# Patient Record
Sex: Female | Born: 1950 | Race: White | Hispanic: No | Marital: Married | Smoking: Former smoker
Health system: Southern US, Community
[De-identification: ages and names within clinical notes are randomized; demographics above are authoritative.]

## PROBLEM LIST (undated history)

## (undated) DIAGNOSIS — H3530 Unspecified macular degeneration: Secondary | ICD-10-CM

## (undated) DIAGNOSIS — F419 Anxiety disorder, unspecified: Secondary | ICD-10-CM

## (undated) DIAGNOSIS — R7303 Prediabetes: Secondary | ICD-10-CM

## (undated) DIAGNOSIS — E7800 Pure hypercholesterolemia, unspecified: Secondary | ICD-10-CM

## (undated) DIAGNOSIS — I10 Essential (primary) hypertension: Secondary | ICD-10-CM

## (undated) HISTORY — PX: COLONOSCOPY: SHX174

## (undated) HISTORY — PX: OTHER SURGICAL HISTORY: SHX169

## (undated) HISTORY — PX: BACK SURGERY: SHX140

---

## 2004-10-29 ENCOUNTER — Ambulatory Visit: Payer: Self-pay | Admitting: Family Medicine

## 2004-11-19 ENCOUNTER — Ambulatory Visit: Payer: Self-pay | Admitting: Family Medicine

## 2004-11-26 ENCOUNTER — Ambulatory Visit: Payer: Self-pay | Admitting: Family Medicine

## 2004-12-03 ENCOUNTER — Encounter: Admission: RE | Admit: 2004-12-03 | Discharge: 2005-01-13 | Payer: Self-pay | Admitting: Family Medicine

## 2005-01-09 ENCOUNTER — Ambulatory Visit: Payer: Self-pay | Admitting: Family Medicine

## 2005-03-11 ENCOUNTER — Ambulatory Visit (HOSPITAL_COMMUNITY): Admission: RE | Admit: 2005-03-11 | Discharge: 2005-03-12 | Payer: Self-pay | Admitting: Neurosurgery

## 2005-08-13 ENCOUNTER — Ambulatory Visit: Payer: Self-pay | Admitting: Family Medicine

## 2005-08-22 ENCOUNTER — Ambulatory Visit: Payer: Self-pay | Admitting: Family Medicine

## 2005-08-22 ENCOUNTER — Ambulatory Visit (HOSPITAL_COMMUNITY): Admission: RE | Admit: 2005-08-22 | Discharge: 2005-08-22 | Payer: Self-pay | Admitting: Family Medicine

## 2005-09-04 ENCOUNTER — Ambulatory Visit: Payer: Self-pay | Admitting: Family Medicine

## 2005-10-27 ENCOUNTER — Ambulatory Visit: Payer: Self-pay | Admitting: Family Medicine

## 2006-05-21 ENCOUNTER — Ambulatory Visit: Payer: Self-pay | Admitting: Family Medicine

## 2006-06-23 ENCOUNTER — Ambulatory Visit: Payer: Self-pay | Admitting: Family Medicine

## 2007-02-17 ENCOUNTER — Ambulatory Visit: Payer: Self-pay | Admitting: Family Medicine

## 2007-03-15 ENCOUNTER — Ambulatory Visit: Payer: Self-pay | Admitting: Family Medicine

## 2007-04-23 ENCOUNTER — Ambulatory Visit (HOSPITAL_COMMUNITY): Admission: RE | Admit: 2007-04-23 | Discharge: 2007-04-23 | Payer: Self-pay | Admitting: Internal Medicine

## 2007-04-23 ENCOUNTER — Ambulatory Visit: Payer: Self-pay | Admitting: Internal Medicine

## 2013-10-20 ENCOUNTER — Telehealth: Payer: Self-pay

## 2013-10-20 NOTE — Telephone Encounter (Signed)
Pt was referred by Marin Olp, NP for screening colonoscopy. Her last one was 03/30/2007 and next one was recommended for 03/2017 and she is on recall. I called pt and she is not having any problems and will wait for the recall. Will send letter to PCP.

## 2016-10-07 DIAGNOSIS — I10 Essential (primary) hypertension: Secondary | ICD-10-CM | POA: Insufficient documentation

## 2017-04-09 ENCOUNTER — Telehealth: Payer: Self-pay

## 2017-04-09 NOTE — Telephone Encounter (Signed)
Pt called to set up 10 yr colonoscopy. [phone]

## 2017-04-15 ENCOUNTER — Telehealth: Payer: Self-pay

## 2017-04-15 NOTE — Telephone Encounter (Signed)
See separate triage. Pt to call back with med list to complete.

## 2017-04-15 NOTE — Telephone Encounter (Signed)
Gastroenterology Pre-Procedure Review  Request Date: 04/15/2017 Requesting Physician: RECALL  PATIENT REVIEW QUESTIONS: The patient responded to the following health history questions as indicated:    1. Diabetes Melitis: yes 2. Joint replacements in the past 12 months: no 3. Major health problems in the past 3 months: no 4. Has an artificial valve or MVP: no 5. Has a defibrillator: no 6. Has been advised in past to take antibiotics in advance of a procedure like teeth cleaning: no 7. Family history of colon cancer: no  8. Alcohol Use: RARELY 9. History of sleep apnea: no  10. History of coronary artery or other vascular stents placed within the last 12 months: no    MEDICATIONS & ALLERGIES:    Patient reports the following regarding taking any blood thinners:   Plavix? no Aspirin? no Coumadin? no Brilinta? no Xarelto? no Eliquis? no Pradaxa? no Savaysa? no Effient? no  Patient confirms/reports the following medications:  Current Outpatient Prescriptions  Medication Sig Dispense Refill  . LORazepam (ATIVAN) 1 MG tablet Take 1 mg by mouth at bedtime. Pt said she only takes one a couple of times a week    . metFORMIN (GLUCOPHAGE) 500 MG tablet Take by mouth daily with breakfast.    . metoprolol tartrate (LOPRESSOR) 25 MG tablet Take 25 mg by mouth 2 (two) times daily.    . pravastatin (PRAVACHOL) 40 MG tablet Take 40 mg by mouth daily.     No current facility-administered medications for this visit.     Patient confirms/reports the following allergies:  No Known Allergies  No orders of the defined types were placed in this encounter.   AUTHORIZATION INFORMATION Primary Insurance:  ID #:   Group #:  Pre-Cert / Auth required:  Pre-Cert / Auth #:   Secondary Insurance:   ID #:   Group #:  Pre-Cert / Auth required:  Pre-Cert / Auth #:   SCHEDULE INFORMATION: Procedure has been scheduled as follows:  Date:        05/22/2017    Time: 11:30 am  Location: [facility]  This Gastroenterology Pre-Precedure Review Form is being routed to the following provider(s): R. Garfield Cornea, MD

## 2017-04-15 NOTE — Telephone Encounter (Signed)
Pt was returning call from DS to give her a list of her meds. She was told to call back at 130. I told her DS was on the other line and I would let her know that she had called back. [phone]

## 2017-04-15 NOTE — Telephone Encounter (Signed)
Ok to schedule.  Day of prep, 1/2 dose metformin.

## 2017-04-15 NOTE — Telephone Encounter (Signed)
See separate triage.  

## 2017-04-16 ENCOUNTER — Other Ambulatory Visit: Payer: Self-pay

## 2017-04-16 DIAGNOSIS — Z1211 Encounter for screening for malignant neoplasm of colon: Secondary | ICD-10-CM

## 2017-04-16 MED ORDER — PEG 3350-KCL-NA BICARB-NACL 420 G PO SOLR: 4000.0000 mL | ORAL | 0 refills | Status: DC

## 2017-04-16 NOTE — Telephone Encounter (Signed)
Rx sent to the pharmacy and instructions ready to print and mail to pt.

## 2017-04-16 NOTE — Telephone Encounter (Signed)
Printed and mailed

## 2017-04-30 NOTE — Telephone Encounter (Signed)
NO PA is needed for TCS 

## 2017-05-19 NOTE — Patient Instructions (Signed)
Your procedure is scheduled on: 05/28/2017  Report to [facility] at  1000  AM.  Call this number if you have problems the morning of surgery: [phone]   Do not eat food or drink liquids :After Midnight.      Take these medicines the morning of surgery with A SIP OF WATER: Metoprolol.   Do not wear jewelry, make-up or nail polish.  Do not wear lotions, powders, or perfumes. You may wear deodorant.  Do not shave 48 hours prior to surgery.  Do not bring valuables to the hospital.  Contacts, dentures or bridgework may not be worn into surgery.  Leave suitcase in the car. After surgery it may be brought to your room.  For patients admitted to the hospital, checkout time is 11:00 AM the day of discharge.   Patients discharged the day of surgery will not be allowed to drive home.  :     Please read over the following fact sheets that you were given: Coughing and Deep Breathing, Surgical Site Infection Prevention, Anesthesia Post-op Instructions and Care and Recovery After Surgery    Cataract A cataract is a clouding of the lens of the eye. When a lens becomes cloudy, vision is reduced based on the degree and nature of the clouding. Many cataracts reduce vision to some degree. Some cataracts make people more near-sighted as they develop. Other cataracts increase glare. Cataracts that are ignored and become worse can sometimes look white. The white color can be seen through the pupil. CAUSES   Aging. However, cataracts may occur at any age, even in newborns.   Certain drugs.   Trauma to the eye.   Certain diseases such as diabetes.   Specific eye diseases such as chronic inflammation inside the eye or a sudden attack of a rare form of glaucoma.   Inherited or acquired medical problems.  SYMPTOMS   Gradual, progressive drop in vision in the affected eye.   Severe, rapid visual loss. This most often happens when trauma is the cause.  DIAGNOSIS  To detect a cataract, an eye doctor  examines the lens. Cataracts are best diagnosed with an exam of the eyes with the pupils enlarged (dilated) by drops.  TREATMENT  For an early cataract, vision may improve by using different eyeglasses or stronger lighting. If that does not help your vision, surgery is the only effective treatment. A cataract needs to be surgically removed when vision loss interferes with your everyday activities, such as driving, reading, or watching TV. A cataract may also have to be removed if it prevents examination or treatment of another eye problem. Surgery removes the cloudy lens and usually replaces it with a substitute lens (intraocular lens, IOL).  At a time when both you and your doctor agree, the cataract will be surgically removed. If you have cataracts in both eyes, only one is usually removed at a time. This allows the operated eye to heal and be out of danger from any possible problems after surgery (such as infection or poor wound healing). In rare cases, a cataract may be doing damage to your eye. In these cases, your caregiver may advise surgical removal right away. The vast majority of people who have cataract surgery have better vision afterward. HOME CARE INSTRUCTIONS  If you are not planning surgery, you may be asked to do the following:  Use different eyeglasses.   Use stronger or brighter lighting.   Ask your eye doctor about reducing your medicine dose or  changing medicines if it is thought that a medicine caused your cataract. Changing medicines does not make the cataract go away on its own.   Become familiar with your surroundings. Poor vision can lead to injury. Avoid bumping into things on the affected side. You are at a higher risk for tripping or falling.   Exercise extreme care when driving or operating machinery.   Wear sunglasses if you are sensitive to bright light or experiencing problems with glare.  SEEK IMMEDIATE MEDICAL CARE IF:   You have a worsening or sudden vision  loss.   You notice redness, swelling, or increasing pain in the eye.   You have a fever.  Document Released: 12/15/2005 Document Revised: 12/04/2011 Document Reviewed: 08/08/2011 [facility] Patient Information 2012 [place].PATIENT INSTRUCTIONS POST-ANESTHESIA  IMMEDIATELY FOLLOWING SURGERY:  Do not drive or operate machinery for the first twenty four hours after surgery.  Do not make any important decisions for twenty four hours after surgery or while taking narcotic pain medications or sedatives.  If you develop intractable nausea and vomiting or a severe headache please notify your doctor immediately.  FOLLOW-UP:  Please make an appointment with your surgeon as instructed. You do not need to follow up with anesthesia unless specifically instructed to do so.  WOUND CARE INSTRUCTIONS (if applicable):  Keep a dry clean dressing on the anesthesia/puncture wound site if there is drainage.  Once the wound has quit draining you may leave it open to air.  Generally you should leave the bandage intact for twenty four hours unless there is drainage.  If the epidural site drains for more than 36-48 hours please call the anesthesia department.  QUESTIONS?:  Please feel free to call your physician or the hospital operator if you have any questions, and they will be happy to assist you.

## 2017-05-21 ENCOUNTER — Encounter (HOSPITAL_COMMUNITY): Payer: Self-pay

## 2017-05-21 ENCOUNTER — Encounter (HOSPITAL_COMMUNITY)
Admission: RE | Admit: 2017-05-21 | Discharge: 2017-05-21 | Disposition: A | Discharge status: Home / Self Care | Payer: Managed Care, Other (non HMO) | Source: Ambulatory Visit | Attending: Ophthalmology | Admitting: Ophthalmology

## 2017-05-21 ENCOUNTER — Other Ambulatory Visit: Payer: Self-pay

## 2017-05-21 DIAGNOSIS — I10 Essential (primary) hypertension: Secondary | ICD-10-CM | POA: Diagnosis not present

## 2017-05-21 DIAGNOSIS — R7303 Prediabetes: Secondary | ICD-10-CM | POA: Diagnosis not present

## 2017-05-21 DIAGNOSIS — Z79899 Other long term (current) drug therapy: Secondary | ICD-10-CM | POA: Diagnosis not present

## 2017-05-21 DIAGNOSIS — Z1211 Encounter for screening for malignant neoplasm of colon: Secondary | ICD-10-CM | POA: Diagnosis not present

## 2017-05-21 DIAGNOSIS — Z7984 Long term (current) use of oral hypoglycemic drugs: Secondary | ICD-10-CM | POA: Diagnosis not present

## 2017-05-21 DIAGNOSIS — Z87891 Personal history of nicotine dependence: Secondary | ICD-10-CM | POA: Diagnosis not present

## 2017-05-21 DIAGNOSIS — F419 Anxiety disorder, unspecified: Secondary | ICD-10-CM | POA: Diagnosis not present

## 2017-05-21 DIAGNOSIS — K5730 Diverticulosis of large intestine without perforation or abscess without bleeding: Secondary | ICD-10-CM | POA: Diagnosis not present

## 2017-05-21 DIAGNOSIS — E7800 Pure hypercholesterolemia, unspecified: Secondary | ICD-10-CM | POA: Diagnosis not present

## 2017-05-21 DIAGNOSIS — D124 Benign neoplasm of descending colon: Secondary | ICD-10-CM | POA: Diagnosis not present

## 2017-05-21 HISTORY — DX: Unspecified macular degeneration: H35.30

## 2017-05-21 HISTORY — DX: Pure hypercholesterolemia, unspecified: E78.00

## 2017-05-21 HISTORY — DX: Anxiety disorder, unspecified: F41.9

## 2017-05-21 HISTORY — DX: Essential (primary) hypertension: I10

## 2017-05-21 HISTORY — DX: Prediabetes: R73.03

## 2017-05-21 LAB — BASIC METABOLIC PANEL
ANION GAP: 7 (ref 5–15)
BUN: 10 mg/dL (ref 6–20)
CALCIUM: 9.1 mg/dL (ref 8.9–10.3)
CO2: 28 mmol/L (ref 22–32)
CREATININE: 0.69 mg/dL (ref 0.44–1.00)
Chloride: 103 mmol/L (ref 101–111)
GFR calc non Af Amer: >60 mL/min (ref >60)
Glucose, Bld: 96 mg/dL (ref 65–99)
Potassium: 3.5 mmol/L (ref 3.5–5.1)
SODIUM: 138 mmol/L (ref 135–145)

## 2017-05-21 LAB — CBC WITH DIFFERENTIAL/PLATELET
BASOS ABS: 0 10*3/uL (ref 0.0–0.1)
BASOS PCT: 1 %
Eosinophils Absolute: 0.2 10*3/uL (ref 0.0–0.7)
Eosinophils Relative: 3 %
HCT: 39 % (ref 36.0–46.0)
HEMOGLOBIN: 12.9 g/dL (ref 12.0–15.0)
LYMPHS ABS: 1.6 10*3/uL (ref 0.7–4.0)
LYMPHS PCT: 27 %
MCH: 28.8 pg (ref 26.0–34.0)
MCHC: 33.1 g/dL (ref 30.0–36.0)
MCV: 87.1 fL (ref 78.0–100.0)
Monocytes Absolute: 0.6 10*3/uL (ref 0.1–1.0)
Monocytes Relative: 11 %
NEUTROS ABS: 3.5 10*3/uL (ref 1.7–7.7)
NEUTROS PCT: 58 %
Platelets: 241 10*3/uL (ref 150–400)
RBC: 4.48 MIL/uL (ref 3.87–5.11)
RDW: 12.6 % (ref 11.5–15.5)
WBC: 5.9 10*3/uL (ref 4.0–10.5)

## 2017-05-22 ENCOUNTER — Ambulatory Visit (HOSPITAL_COMMUNITY)
Admission: RE | Admit: 2017-05-22 | Discharge: 2017-05-22 | Disposition: A | Discharge status: Home / Self Care | Payer: Managed Care, Other (non HMO) | Source: Ambulatory Visit | Attending: Internal Medicine | Admitting: Internal Medicine

## 2017-05-22 ENCOUNTER — Encounter (HOSPITAL_COMMUNITY): Payer: Self-pay | Admitting: *Deleted

## 2017-05-22 ENCOUNTER — Encounter (HOSPITAL_COMMUNITY)
Admission: RE | Disposition: A | Discharge status: Home / Self Care | Payer: Self-pay | Source: Ambulatory Visit | Attending: Internal Medicine

## 2017-05-22 DIAGNOSIS — Z1212 Encounter for screening for malignant neoplasm of rectum: Secondary | ICD-10-CM | POA: Diagnosis not present

## 2017-05-22 DIAGNOSIS — E7800 Pure hypercholesterolemia, unspecified: Secondary | ICD-10-CM | POA: Insufficient documentation

## 2017-05-22 DIAGNOSIS — Z1211 Encounter for screening for malignant neoplasm of colon: Secondary | ICD-10-CM | POA: Insufficient documentation

## 2017-05-22 DIAGNOSIS — F419 Anxiety disorder, unspecified: Secondary | ICD-10-CM | POA: Insufficient documentation

## 2017-05-22 DIAGNOSIS — Z7984 Long term (current) use of oral hypoglycemic drugs: Secondary | ICD-10-CM | POA: Insufficient documentation

## 2017-05-22 DIAGNOSIS — D124 Benign neoplasm of descending colon: Secondary | ICD-10-CM | POA: Insufficient documentation

## 2017-05-22 DIAGNOSIS — Z87891 Personal history of nicotine dependence: Secondary | ICD-10-CM | POA: Insufficient documentation

## 2017-05-22 DIAGNOSIS — I10 Essential (primary) hypertension: Secondary | ICD-10-CM | POA: Insufficient documentation

## 2017-05-22 DIAGNOSIS — R7303 Prediabetes: Secondary | ICD-10-CM | POA: Insufficient documentation

## 2017-05-22 DIAGNOSIS — Z79899 Other long term (current) drug therapy: Secondary | ICD-10-CM | POA: Insufficient documentation

## 2017-05-22 DIAGNOSIS — K5730 Diverticulosis of large intestine without perforation or abscess without bleeding: Secondary | ICD-10-CM | POA: Insufficient documentation

## 2017-05-22 HISTORY — PX: COLONOSCOPY: SHX5424

## 2017-05-22 LAB — GLUCOSE, CAPILLARY: Glucose-Capillary: 91 mg/dL (ref 65–99)

## 2017-05-22 SURGERY — COLONOSCOPY
Anesthesia: Moderate Sedation

## 2017-05-22 MED ORDER — STERILE WATER FOR IRRIGATION IR SOLN
Status: DC | PRN
  Administered 2017-05-22: 2.5 mL

## 2017-05-22 MED ORDER — ONDANSETRON HCL 4 MG/2ML IJ SOLN
INTRAMUSCULAR | Status: DC | PRN
  Administered 2017-05-22: 4 mg via INTRAVENOUS

## 2017-05-22 MED ORDER — MIDAZOLAM HCL 5 MG/5ML IJ SOLN
INTRAMUSCULAR | Status: AC
  Filled 2017-05-22: qty 10

## 2017-05-22 MED ORDER — MEPERIDINE HCL 100 MG/ML IJ SOLN
INTRAMUSCULAR | Status: DC | PRN
  Administered 2017-05-22: 50 mg via INTRAVENOUS

## 2017-05-22 MED ORDER — SODIUM CHLORIDE 0.9 % IV SOLN
INTRAVENOUS | Status: DC
Start: 2017-05-22 — End: 2017-05-22
  Administered 2017-05-22: 1000 mL via INTRAVENOUS

## 2017-05-22 MED ORDER — MEPERIDINE HCL 100 MG/ML IJ SOLN
INTRAMUSCULAR | Status: AC
  Filled 2017-05-22: qty 2

## 2017-05-22 MED ORDER — ONDANSETRON HCL 4 MG/2ML IJ SOLN
INTRAMUSCULAR | Status: AC
  Filled 2017-05-22: qty 2

## 2017-05-22 MED ORDER — MIDAZOLAM HCL 5 MG/5ML IJ SOLN
INTRAMUSCULAR | Status: DC | PRN
  Administered 2017-05-22 (×2): 1 mg via INTRAVENOUS
  Administered 2017-05-22: 2 mg via INTRAVENOUS

## 2017-05-22 NOTE — Discharge Instructions (Addendum)
Colonoscopy Discharge Instructions  Read the instructions outlined below and refer to this sheet in the next few weeks. These discharge instructions provide you with general information on caring for yourself after you leave the hospital. Your doctor may also give you specific instructions. While your treatment has been planned according to the most current medical practices available, unavoidable complications occasionally occur. If you have any problems or questions after discharge, call Dr. Gala Romney at [phone]. ACTIVITY  You may resume your regular activity, but move at a slower pace for the next 24 hours.   Take frequent rest periods for the next 24 hours.   Walking will help get rid of the air and reduce the bloated feeling in your belly (abdomen).   No driving for 24 hours (because of the medicine (anesthesia) used during the test).    Do not sign any important legal documents or operate any machinery for 24 hours (because of the anesthesia used during the test).  NUTRITION  Drink plenty of fluids.   You may resume your normal diet as instructed by your doctor.   Begin with a light meal and progress to your normal diet. Heavy or fried foods are harder to digest and may make you feel sick to your stomach (nauseated).   Avoid alcoholic beverages for 24 hours or as instructed.  MEDICATIONS  You may resume your normal medications unless your doctor tells you otherwise.  WHAT YOU CAN EXPECT TODAY  Some feelings of bloating in the abdomen.   Passage of more gas than usual.   Spotting of blood in your stool or on the toilet paper.  IF YOU HAD POLYPS REMOVED DURING THE COLONOSCOPY:  No aspirin products for 7 days or as instructed.   No alcohol for 7 days or as instructed.   Eat a soft diet for the next 24 hours.  FINDING OUT THE RESULTS OF YOUR TEST Not all test results are available during your visit. If your test results are not back during the visit, make an appointment  with your caregiver to find out the results. Do not assume everything is normal if you have not heard from your caregiver or the medical facility. It is important for you to follow up on all of your test results.  SEEK IMMEDIATE MEDICAL ATTENTION IF:  You have more than a spotting of blood in your stool.   Your belly is swollen (abdominal distention).   You are nauseated or vomiting.   You have a temperature over 101.   You have abdominal pain or discomfort that is severe or gets worse throughout the day.    Colon diverticulosis and polyp information provided  Further recommendations to follow pending review of pathology report\     Diverticulosis Diverticulosis is a condition that develops when small pouches (diverticula) form in the wall of the large intestine (colon). The colon is where water is absorbed and stool is formed. The pouches form when the inside layer of the colon pushes through weak spots in the outer layers of the colon. You may have a few pouches or many of them. What are the causes? The cause of this condition is not known. What increases the risk? The following factors may make you more likely to develop this condition:  Being older than age 12. Your risk for this condition increases with age. Diverticulosis is rare among people younger than age 76. By age 10, many people have it.  Eating a low-fiber diet.  Having frequent constipation.  Being overweight.  Not getting enough exercise.  Smoking.  Taking over-the-counter pain medicines, like aspirin and ibuprofen.  Having a family history of diverticulosis. What are the signs or symptoms? In most people, there are no symptoms of this condition. If you do have symptoms, they may include:  Bloating.  Cramps in the abdomen.  Constipation or diarrhea.  Pain in the lower left side of the abdomen. How is this diagnosed? This condition is most often diagnosed during an exam for other colon problems.  Because diverticulosis usually has no symptoms, it often cannot be diagnosed independently. This condition may be diagnosed by:  Using a flexible scope to examine the colon (colonoscopy).  Taking an X-ray of the colon after dye has been put into the colon (barium enema).  Doing a CT scan. How is this treated? You may not need treatment for this condition if you have never developed an infection related to diverticulosis. If you have had an infection before, treatment may include:  Eating a high-fiber diet. This may include eating more fruits, vegetables, and grains.  Taking a fiber supplement.  Taking a live bacteria supplement (probiotic).  Taking medicine to relax your colon.  Taking antibiotic medicines. Follow these instructions at home:  Drink 6-8 glasses of water or more each day to prevent constipation.  Try not to strain when you have a bowel movement.  If you have had an infection before:  Eat more fiber as directed by your health care provider or your diet and nutrition specialist (dietitian).  Take a fiber supplement or probiotic, if your health care provider approves.  Take over-the-counter and prescription medicines only as told by your health care provider.  If you were prescribed an antibiotic, take it as told by your health care provider. Do not stop taking the antibiotic even if you start to feel better.  Keep all follow-up visits as told by your health care provider. This is important. Contact a health care provider if:  You have pain in your abdomen.  You have bloating.  You have cramps.  You have not had a bowel movement in 3 days. Get help right away if:  Your pain gets worse.  Your bloating becomes very bad.  You have a fever or chills, and your symptoms suddenly get worse.  You vomit.  You have bowel movements that are bloody or black.  You have bleeding from your rectum. Summary  Diverticulosis is a condition that develops when small  pouches (diverticula) form in the wall of the large intestine (colon).  You may have a few pouches or many of them.  This condition is most often diagnosed during an exam for other colon problems.  If you have had an infection related to diverticulosis, treatment may include increasing the fiber in your diet, taking supplements, or taking medicines. This information is not intended to replace advice given to you by your health care provider. Make sure you discuss any questions you have with your health care provider. Document Released: 09/11/2004 Document Revised: 11/03/2016 Document Reviewed: 11/03/2016 Elsevier Interactive Patient Education  2017 [place].    Colon Polyps Polyps are tissue growths inside the body. Polyps can grow in many places, including the large intestine (colon). A polyp may be a round bump or a mushroom-shaped growth. You could have one polyp or several. Most colon polyps are noncancerous (benign). However, some colon polyps can become cancerous over time. What are the causes? The exact cause of colon polyps is not known.  What increases the risk? This condition is more likely to develop in people who: Have a family history of colon cancer or colon polyps. Are older than 50 or older than 45 if they are African American. Have inflammatory bowel disease, such as ulcerative colitis or Crohn disease. Are overweight. Smoke cigarettes. Do not get enough exercise. Drink too much alcohol. Eat a diet that is: High in fat and red meat. Low in fiber. Had childhood cancer that was treated with abdominal radiation. What are the signs or symptoms? Most polyps do not cause symptoms. If you have symptoms, they may include: Blood coming from your rectum when having a bowel movement. Blood in your stool.The stool may look dark red or black. A change in bowel habits, such as constipation or diarrhea. How is this diagnosed? This condition is diagnosed with a colonoscopy.  This is a procedure that uses a lighted, flexible scope to look at the inside of your colon. How is this treated? Treatment for this condition involves removing any polyps that are found. Those polyps will then be tested for cancer. If cancer is found, your health care provider will talk to you about options for colon cancer treatment. Follow these instructions at home: Diet  Eat plenty of fiber, such as fruits, vegetables, and whole grains. Eat foods that are high in calcium and vitamin D, such as milk, cheese, yogurt, eggs, liver, fish, and broccoli. Limit foods high in fat, red meats, and processed meats, such as hot dogs, sausage, bacon, and lunch meats. Maintain a healthy weight, or lose weight if recommended by your health care provider. General instructions  Do not smoke cigarettes. Do not drink alcohol excessively. Keep all follow-up visits as told by your health care provider. This is important. This includes keeping regularly scheduled colonoscopies. Talk to your health care provider about when you need a colonoscopy. Exercise every day or as told by your health care provider. Contact a health care provider if: You have new or worsening bleeding during a bowel movement. You have new or increased blood in your stool. You have a change in bowel habits. You unexpectedly lose weight. This information is not intended to replace advice given to you by your health care provider. Make sure you discuss any questions you have with your health care provider. Document Released: 09/10/2004 Document Revised: 05/22/2016 Document Reviewed: 11/05/2015 Elsevier Interactive Patient Education  2017 [employer].

## 2017-05-22 NOTE — H&P (Signed)
@LOGO @   Primary Care Physician:  Dione Housekeeper, MD Primary Gastroenterologist:  Dr. Gala Romney  Pre-Procedure History & Physical: HPI:  Beverly Morgan is a 66 y.o. female here for a screening colonoscopy. Negative colonoscopy reportedly 10 years ago-those records are not available. No bowel symptoms. No family history of colon cancer.  Past Medical History:  Diagnosis Date  . Anxiety   . Hypercholesteremia   . Hypertension   . Macular degeneration   . Pre-diabetes   . Prediabetes     Past Surgical History:  Procedure Laterality Date  . BACK SURGERY     lumbar discectomy  . CESAREAN SECTION     x3  . COLONOSCOPY    . removal bakers cyst Right    Knee    Prior to Admission medications   Medication Sig Start Date End Date Taking? Authorizing Provider  cetirizine (ZYRTEC) 5 MG tablet Take 5 mg by mouth daily.   Yes [provider]  LORazepam (ATIVAN) 1 MG tablet Take 1 mg by mouth at bedtime. Pt said she only takes one a couple of times a week   Yes [provider]  metFORMIN (GLUCOPHAGE-XR) 500 MG 24 hr tablet Take 1 tablet by mouth daily. 05/05/17  Yes [provider]  metoprolol tartrate (LOPRESSOR) 25 MG tablet Take 25 mg by mouth 2 (two) times daily.   Yes [provider]  polyethylene glycol-electrolytes (TRILYTE) 420 g solution Take 4,000 mLs by mouth as directed. 04/16/17  Yes Rourk, Cristopher Estimable, MD  pravastatin (PRAVACHOL) 40 MG tablet Take 40 mg by mouth daily.    [provider]    Allergies as of 04/16/2017  . (No Known Allergies)    Family History  Problem Relation Age of Onset  . Kidney disease Mother   . Lung cancer Father   . Diabetes Sister   . Kidney cancer Brother   . Diabetes Sister   . Diabetes Sister   . Diabetes Brother   . Coronary artery disease Brother     Social History   Social History  . Marital status: Married    Spouse name: N/A  . Number of children: N/A  . Years of education: N/A    Occupational History  . Not on file.   Social History Main Topics  . Smoking status: Former Smoker    Packs/day: 2.00    Years: 11.00    Types: Cigarettes    Quit date: 05/21/1997  . Smokeless tobacco: Never Used  . Alcohol use Yes     Comment: seldom  . Drug use: No  . Sexual activity: Yes    Birth control/ protection: Post-menopausal   Other Topics Concern  . Not on file   Social History Narrative  . No narrative on file    Review of Systems: See HPI, otherwise negative ROS  Physical Exam: BP 137/68   Pulse 65   Temp 98.4 F (36.9 C) (Oral)   Resp 12   Ht 5\' 5"  (1.651 m)   Wt 159 lb (72.1 kg)   SpO2 95%   BMI 26.46 kg/m  General:   Alert,  Well-developed, well-nourished, pleasant and cooperative in NAD Mouth:  No deformity or lesions. Neck:  Supple; no masses or thyromegaly. No significant cervical adenopathy. Lungs:  Clear throughout to auscultation.   No wheezes, crackles, or rhonchi. No acute distress. Heart:  Regular rate and rhythm; no murmurs, clicks, rubs,  or gallops. Abdomen: Non-distended, normal bowel sounds.  Soft and nontender without  appreciable mass or hepatosplenomegaly.  Pulses:  Normal pulses noted. Extremities:  Without clubbing or edema.  Impression/ Recommendations:  Pleasant 66 year old lady here for average risk screening colonoscopy.  I have offered the patient a screening colonoscopy today.  The risks, benefits, limitations, alternatives and imponderables have been reviewed with the patient. Questions have been answered. All parties are agreeable.      Notice: This dictation was prepared with Dragon dictation along with smaller phrase technology. Any transcriptional errors that result from this process are unintentional and may not be corrected upon review.

## 2017-05-22 NOTE — Op Note (Signed)
[facility] Patient Name: Beverly Morgan Procedure Date: 05/22/2017 10:28 AM MRN: [id number] Date of Birth: 09/07/1951 Attending MD: Norvel Richards , MD CSN: [id number] Age: 66 Admit Type: Outpatient Procedure:                Colonoscopy Indications:              Screening for colorectal malignant neoplasm Providers:                Norvel Richards, MD, Rosina Lowenstein, RN, Charlyne Petrin RN, RN Referring MD:              Medicines:                Midazolam 6 mg IV, Meperidine 628 mg IV Complications:            No immediate complications. Estimated Blood Loss:     Estimated blood loss was minimal. Procedure:                Pre-Anesthesia Assessment:                           - Prior to the procedure, a History and Physical                            was performed, and patient medications and                            allergies were reviewed. The patient's tolerance of                            previous anesthesia was also reviewed. The risks                            and benefits of the procedure and the sedation                            options and risks were discussed with the patient.                            All questions were answered, and informed consent                            was obtained. Prior Anticoagulants: The patient has                            taken no previous anticoagulant or antiplatelet                            agents. ASA Grade Assessment: II - A patient with                            mild systemic disease. After reviewing the risks  and benefits, the patient was deemed in                            satisfactory condition to undergo the procedure.                           After obtaining informed consent, the colonoscope                            was passed under direct vision. Throughout the                            procedure, the patient's blood pressure, pulse, and                    oxygen saturations were monitored continuously. The                            EC-[id number] ([id number]) scope was introduced through                            the anus and advanced to the the cecum, identified                            by appendiceal orifice and ileocecal valve. The                            ileocecal valve, appendiceal orifice, and rectum                            were photographed. The colonoscopy was performed                            without difficulty. The patient tolerated the                            procedure well. The quality of the bowel                            preparation was adequate. The quality of the bowel                            preparation was adequate. Scope In: 10:47:29 AM Scope Out: 10:59:46 AM Scope Withdrawal Time: 0 hours 7 minutes 15 seconds  Total Procedure Duration: 0 hours 12 minutes 17 seconds  Findings:      The perianal and digital rectal examinations were normal.      Scattered small and large-mouthed diverticula were found in the entire       colon.      A 6 mm polyp was found in the descending colon. The polyp was sessile.      The exam was otherwise without abnormality on direct and retroflexion       views. The polyp was removed with a cold snare. Resection and retrieval       were complete. Estimated blood loss was minimal. Impression:               -  Diverticulosis in the entire examined colon.                           - One 6 mm polyp in the descending colon, removed                            with a cold snare. Resected and retrieved.                           - The examination was otherwise normal on direct                            and retroflexion views. Moderate Sedation:      Moderate (conscious) sedation was administered by the endoscopy nurse       and supervised by the endoscopist. The following parameters were       monitored: oxygen saturation, heart rate, blood pressure, respiratory        rate, EKG, adequacy of pulmonary ventilation, and response to care.       Total physician intraservice time was 29 minutes. Recommendation:           - Patient has a contact number available for                            emergencies. The signs and symptoms of potential                            delayed complications were discussed with the                            patient. Return to normal activities tomorrow.                            Written discharge instructions were provided to the                            patient.                           - Resume previous diet.                           - Continue present medications.                           - Await pathology results.                           - Repeat colonoscopy date to be determined after                            pending pathology results are reviewed for                            surveillance based on pathology results.                           -  Return to GI clinic (date not yet determined). Procedure Code(s):        --- Professional ---                           [phone], Colonoscopy, flexible; with removal of                            tumor(s), polyp(s), or other lesion(s) by snare                            technique                           99152, Moderate sedation services provided by the                            same physician or other qualified health care                            professional performing the diagnostic or                            therapeutic service that the sedation supports,                            requiring the presence of an independent trained                            observer to assist in the monitoring of the                            patient's level of consciousness and physiological                            status; initial 15 minutes of intraservice time,                            patient age 40 years or older                           [phone], Moderate sedation  services; each additional                            15 minutes intraservice time Diagnosis Code(s):        --- Professional ---                           Z12.11, Encounter for screening for malignant                            neoplasm of colon                           D12.4, Benign neoplasm of descending colon  K57.30, Diverticulosis of large intestine without                            perforation or abscess without bleeding CPT copyright 2016 [employer]. All rights reserved. The codes documented in this report are preliminary and upon coder review may  be revised to meet current compliance requirements. Cristopher Estimable. Kendria Halberg, MD Norvel Richards, MD 05/22/2017 11:20:18 AM This report has been signed electronically. Number of Addenda: 0

## 2017-05-27 ENCOUNTER — Encounter: Payer: Self-pay | Admitting: Internal Medicine

## 2017-05-28 ENCOUNTER — Ambulatory Visit (HOSPITAL_COMMUNITY): Payer: Managed Care, Other (non HMO) | Admitting: Anesthesiology

## 2017-05-28 ENCOUNTER — Encounter (HOSPITAL_COMMUNITY): Payer: Self-pay | Admitting: *Deleted

## 2017-05-28 ENCOUNTER — Encounter (HOSPITAL_COMMUNITY)
Admission: RE | Disposition: A | Discharge status: Home / Self Care | Payer: Self-pay | Source: Ambulatory Visit | Attending: Ophthalmology

## 2017-05-28 ENCOUNTER — Ambulatory Visit (HOSPITAL_COMMUNITY)
Admission: RE | Admit: 2017-05-28 | Discharge: 2017-05-28 | Disposition: A | Discharge status: Home / Self Care | Payer: Managed Care, Other (non HMO) | Source: Ambulatory Visit | Attending: Ophthalmology | Admitting: Ophthalmology

## 2017-05-28 DIAGNOSIS — Z7984 Long term (current) use of oral hypoglycemic drugs: Secondary | ICD-10-CM | POA: Diagnosis not present

## 2017-05-28 DIAGNOSIS — H2512 Age-related nuclear cataract, left eye: Secondary | ICD-10-CM | POA: Insufficient documentation

## 2017-05-28 DIAGNOSIS — Z87891 Personal history of nicotine dependence: Secondary | ICD-10-CM | POA: Diagnosis not present

## 2017-05-28 DIAGNOSIS — E119 Type 2 diabetes mellitus without complications: Secondary | ICD-10-CM | POA: Insufficient documentation

## 2017-05-28 DIAGNOSIS — Z79899 Other long term (current) drug therapy: Secondary | ICD-10-CM | POA: Diagnosis not present

## 2017-05-28 DIAGNOSIS — I10 Essential (primary) hypertension: Secondary | ICD-10-CM | POA: Insufficient documentation

## 2017-05-28 DIAGNOSIS — F419 Anxiety disorder, unspecified: Secondary | ICD-10-CM | POA: Diagnosis not present

## 2017-05-28 HISTORY — PX: CATARACT EXTRACTION W/PHACO: SHX586

## 2017-05-28 LAB — GLUCOSE, CAPILLARY: Glucose-Capillary: 90 mg/dL (ref 65–99)

## 2017-05-28 SURGERY — PHACOEMULSIFICATION, CATARACT, WITH IOL INSERTION
Anesthesia: Monitor Anesthesia Care | Site: Eye | Laterality: Left

## 2017-05-28 MED ORDER — MIDAZOLAM HCL 2 MG/2ML IJ SOLN
INTRAMUSCULAR | Status: AC
  Filled 2017-05-28: qty 2

## 2017-05-28 MED ORDER — NEOMYCIN-POLYMYXIN-DEXAMETH 3.5-10000-0.1 OP SUSP
OPHTHALMIC | Status: DC | PRN
  Administered 2017-05-28: 2 [drp] via OPHTHALMIC

## 2017-05-28 MED ORDER — LACTATED RINGERS IV SOLN
INTRAVENOUS | Status: DC
  Administered 2017-05-28: 10:00:00 via INTRAVENOUS

## 2017-05-28 MED ORDER — POVIDONE-IODINE 5 % OP SOLN
OPHTHALMIC | Status: DC | PRN
  Administered 2017-05-28: 1 via OPHTHALMIC

## 2017-05-28 MED ORDER — EPINEPHRINE PF 1 MG/ML IJ SOLN
INTRAMUSCULAR | Status: DC | PRN
  Administered 2017-05-28: 500 mL

## 2017-05-28 MED ORDER — EPINEPHRINE PF 1 MG/ML IJ SOLN
INTRAMUSCULAR | Status: AC
  Filled 2017-05-28: qty 1

## 2017-05-28 MED ORDER — PHENYLEPHRINE HCL 2.5 % OP SOLN
1.0000 [drp] | OPHTHALMIC | Status: AC
  Administered 2017-05-28 (×3): 1 [drp] via OPHTHALMIC

## 2017-05-28 MED ORDER — FENTANYL CITRATE (PF) 100 MCG/2ML IJ SOLN
INTRAMUSCULAR | Status: AC
  Filled 2017-05-28: qty 2

## 2017-05-28 MED ORDER — FENTANYL CITRATE (PF) 100 MCG/2ML IJ SOLN
25.0000 ug | Freq: Once | INTRAMUSCULAR | Status: AC
  Administered 2017-05-28: 25 ug via INTRAVENOUS

## 2017-05-28 MED ORDER — LIDOCAINE HCL (PF) 1 % IJ SOLN
INTRAMUSCULAR | Status: DC | PRN
  Administered 2017-05-28: .8 mL

## 2017-05-28 MED ORDER — BSS IO SOLN
INTRAOCULAR | Status: DC | PRN
  Administered 2017-05-28: 15 mL

## 2017-05-28 MED ORDER — LIDOCAINE HCL 3.5 % OP GEL
1.0000 "application " | Freq: Once | OPHTHALMIC | Status: AC
  Administered 2017-05-28: 1 via OPHTHALMIC

## 2017-05-28 MED ORDER — MIDAZOLAM HCL 2 MG/2ML IJ SOLN
1.0000 mg | INTRAMUSCULAR | Status: AC
  Administered 2017-05-28 (×2): 1 mg via INTRAVENOUS

## 2017-05-28 MED ORDER — CYCLOPENTOLATE-PHENYLEPHRINE 0.2-1 % OP SOLN
1.0000 [drp] | OPHTHALMIC | Status: AC
  Administered 2017-05-28 (×3): 1 [drp] via OPHTHALMIC

## 2017-05-28 MED ORDER — PROVISC 10 MG/ML IO SOLN
INTRAOCULAR | Status: DC | PRN
  Administered 2017-05-28: 0.85 mL via INTRAOCULAR

## 2017-05-28 MED ORDER — TETRACAINE HCL 0.5 % OP SOLN
1.0000 [drp] | OPHTHALMIC | Status: AC
  Administered 2017-05-28 (×3): 1 [drp] via OPHTHALMIC

## 2017-05-28 SURGICAL SUPPLY — 12 items

## 2017-05-28 NOTE — Op Note (Signed)
Date of Admission: 05/28/2017  Date of Surgery: 05/28/2017  Pre-Op Dx: Cataract Left  Eye  Post-Op Dx: Senile Nuclear Cataract  Left  Eye,  Dx Code H25.12  Surgeon: Tonny Branch, M.D.  Assistants: None  Anesthesia: Topical with MAC  Indications: Painless, progressive loss of vision with compromise of daily activities.  Surgery: Cataract Extraction with Intraocular lens Implant Left Eye  Discription: The patient had dilating drops and viscous lidocaine placed into the Left eye in the pre-op holding area. After transfer to the operating room, a time out was performed. The patient was then prepped and draped. Beginning with a 80m blade a paracentesis port was made at the surgeon's 2 o'clock position. The anterior chamber was then filled with 1% non-preserved lidocaine. This was followed by filling the anterior chamber with Provisc.  A 2.467mkeratome blade was used to make a clear corneal incision at the temporal limbus.  A bent cystatome needle was used to create a continuous tear capsulotomy. Hydrodissection was performed with balanced salt solution on a Fine canula. The lens nucleus was then removed using the phacoemulsification handpiece. Residual cortex was removed with the I&A handpiece. The anterior chamber and capsular bag were refilled with Provisc. A posterior chamber intraocular lens was placed into the capsular bag with it's injector. The implant was positioned with the Kuglan hook. The Provisc was then removed from the anterior chamber and capsular bag with the I&A handpiece. Stromal hydration of the main incision and paracentesis port was performed with BSS on a Fine canula. The wounds were tested for leak which was negative. The patient tolerated the procedure well. There were no operative complications. The patient was then transferred to the recovery room in stable condition.  Complications: None  Specimen: None  EBL: None  Prosthetic device: Abbott Technis, PCB00, power 24.5, SN  [id number]

## 2017-05-28 NOTE — Anesthesia Postprocedure Evaluation (Signed)
Anesthesia Post Note  Patient: Beverly Morgan  Procedure(s) Performed: Procedure(s) (LRB): CATARACT EXTRACTION PHACO AND INTRAOCULAR LENS PLACEMENT LEFT EYE cde=5.12 (Left)  Patient location during evaluation: Short Stay Anesthesia Type: MAC Level of consciousness: awake and alert and oriented Pain management: pain level controlled Vital Signs Assessment: post-procedure vital signs reviewed and stable Respiratory status: spontaneous breathing Cardiovascular status: blood pressure returned to baseline Postop Assessment: no signs of nausea or vomiting Anesthetic complications: no     Last Vitals:  Vitals:   05/28/17 1020 05/28/17 1030  BP: 133/87 134/82  Pulse:    Resp: 17 (!) 32  Temp:      Last Pain:  Vitals:   05/28/17 1006  TempSrc: Oral                 Divon Krabill

## 2017-05-28 NOTE — Anesthesia Preprocedure Evaluation (Signed)
Anesthesia Evaluation  Patient identified by MRN, date of birth, ID band Patient awake    Reviewed: Allergy & Precautions, NPO status , Patient's Chart, lab work & pertinent test results  Airway Mallampati: I  TM Distance: >3 FB     Dental  (+) Edentulous Upper, Edentulous Lower   Pulmonary former smoker,    breath sounds clear to auscultation       Cardiovascular hypertension, Pt. on medications  Rhythm:Regular Rate:Normal     Neuro/Psych PSYCHIATRIC DISORDERS Anxiety    GI/Hepatic negative GI ROS, Neg liver ROS,   Endo/Other  diabetes, Type 2, Oral Hypoglycemic Agents  Renal/GU negative Renal ROS     Musculoskeletal   Abdominal   Peds  Hematology   Anesthesia Other Findings   Reproductive/Obstetrics                             Anesthesia Physical Anesthesia Plan  ASA: II  Anesthesia Plan: MAC   Post-op Pain Management:    Induction: Intravenous  Airway Management Planned: Nasal Cannula  Additional Equipment:   Intra-op Plan:   Post-operative Plan:   Informed Consent: I have reviewed the patients History and Physical, chart, labs and discussed the procedure including the risks, benefits and alternatives for the proposed anesthesia with the patient or authorized representative who has indicated his/her understanding and acceptance.     Plan Discussed with:   Anesthesia Plan Comments:         Anesthesia Quick Evaluation

## 2017-05-28 NOTE — H&P (Signed)
I have reviewed the H&P, the patient was re-examined, and I have identified no interval changes in medical condition and plan of care since the history and physical of record  

## 2017-05-28 NOTE — Discharge Instructions (Signed)

## 2017-05-28 NOTE — Transfer of Care (Signed)
Immediate Anesthesia Transfer of Care Note  Patient: Beverly Morgan  Procedure(s) Performed: Procedure(s) with comments: CATARACT EXTRACTION PHACO AND INTRAOCULAR LENS PLACEMENT LEFT EYE cde=5.12 (Left) - left  Patient Location: Short Stay  Anesthesia Type:MAC  Level of Consciousness: awake  Airway & Oxygen Therapy: Patient Spontanous Breathing  Post-op Assessment: Report given to RN  Post vital signs: Reviewed  Last Vitals:  Vitals:   05/28/17 1020 05/28/17 1030  BP: 133/87 134/82  Pulse:    Resp: 17 (!) 32  Temp:      Last Pain:  Vitals:   05/28/17 1006  TempSrc: Oral      Patients Stated Pain Goal: 6 (97/02/63 7858)  Complications: No apparent anesthesia complications

## 2017-05-29 ENCOUNTER — Encounter (HOSPITAL_COMMUNITY): Payer: Self-pay | Admitting: Ophthalmology

## 2017-06-03 ENCOUNTER — Telehealth: Payer: Self-pay | Admitting: Internal Medicine

## 2017-06-03 NOTE — Telephone Encounter (Signed)
I called and informed the patient that the diverticulosis recommendations are on the discharge sheet she was given after her procedure at the hospital. Dr.Rourk recommended a high fiber diet and I am mailing her an additional high fiber diet sheet. Pt said she understood.

## 2017-06-03 NOTE — Telephone Encounter (Signed)
Pt called to say that she received her letter from [facility] about her results and wanted to know how big her polyp was. I told her per nurse that the one polyp removed was 79mm. Then she wanted to know how big that was. I told her that I didn't know and most rulers have metric on them. Then she said that she has diverticulosis and wanted to know what she can and can not eat. I told her that the nurse would have to call her back and answer her questions. [phone]

## 2017-06-17 ENCOUNTER — Encounter (HOSPITAL_COMMUNITY)
Admission: RE | Admit: 2017-06-17 | Discharge: 2017-06-17 | Disposition: A | Discharge status: Home / Self Care | Payer: Managed Care, Other (non HMO) | Source: Ambulatory Visit | Attending: Ophthalmology | Admitting: Ophthalmology

## 2017-06-22 ENCOUNTER — Encounter (HOSPITAL_COMMUNITY)
Admission: RE | Disposition: A | Discharge status: Home / Self Care | Payer: Self-pay | Source: Ambulatory Visit | Attending: Ophthalmology

## 2017-06-22 ENCOUNTER — Ambulatory Visit (HOSPITAL_COMMUNITY)
Admission: RE | Admit: 2017-06-22 | Discharge: 2017-06-22 | Disposition: A | Discharge status: Home / Self Care | Payer: Managed Care, Other (non HMO) | Source: Ambulatory Visit | Attending: Ophthalmology | Admitting: Ophthalmology

## 2017-06-22 ENCOUNTER — Ambulatory Visit (HOSPITAL_COMMUNITY): Payer: Managed Care, Other (non HMO) | Admitting: Anesthesiology

## 2017-06-22 ENCOUNTER — Encounter (HOSPITAL_COMMUNITY): Payer: Self-pay | Admitting: *Deleted

## 2017-06-22 DIAGNOSIS — I10 Essential (primary) hypertension: Secondary | ICD-10-CM | POA: Insufficient documentation

## 2017-06-22 DIAGNOSIS — H2511 Age-related nuclear cataract, right eye: Secondary | ICD-10-CM | POA: Insufficient documentation

## 2017-06-22 DIAGNOSIS — Z87891 Personal history of nicotine dependence: Secondary | ICD-10-CM | POA: Insufficient documentation

## 2017-06-22 DIAGNOSIS — Z79899 Other long term (current) drug therapy: Secondary | ICD-10-CM | POA: Insufficient documentation

## 2017-06-22 HISTORY — PX: CATARACT EXTRACTION W/PHACO: SHX586

## 2017-06-22 LAB — GLUCOSE, CAPILLARY: GLUCOSE-CAPILLARY: 93 mg/dL (ref 65–99)

## 2017-06-22 SURGERY — PHACOEMULSIFICATION, CATARACT, WITH IOL INSERTION
Anesthesia: Monitor Anesthesia Care | Site: Eye | Laterality: Right

## 2017-06-22 MED ORDER — BSS IO SOLN
INTRAOCULAR | Status: DC | PRN
  Administered 2017-06-22: 500 mL

## 2017-06-22 MED ORDER — PROVISC 10 MG/ML IO SOLN
INTRAOCULAR | Status: DC | PRN
  Administered 2017-06-22: 0.85 mL via INTRAOCULAR

## 2017-06-22 MED ORDER — BSS IO SOLN
INTRAOCULAR | Status: DC | PRN
  Administered 2017-06-22: 15 mL

## 2017-06-22 MED ORDER — LIDOCAINE HCL (PF) 1 % IJ SOLN
INTRAMUSCULAR | Status: DC | PRN
  Administered 2017-06-22: .5 mL

## 2017-06-22 MED ORDER — CYCLOPENTOLATE-PHENYLEPHRINE 0.2-1 % OP SOLN
1.0000 [drp] | OPHTHALMIC | Status: AC
  Administered 2017-06-22 (×3): 1 [drp] via OPHTHALMIC

## 2017-06-22 MED ORDER — MIDAZOLAM HCL 2 MG/2ML IJ SOLN
INTRAMUSCULAR | Status: AC
  Filled 2017-06-22: qty 2

## 2017-06-22 MED ORDER — NEOMYCIN-POLYMYXIN-DEXAMETH 3.5-10000-0.1 OP SUSP
OPHTHALMIC | Status: DC | PRN
  Administered 2017-06-22: 2 [drp] via OPHTHALMIC

## 2017-06-22 MED ORDER — TETRACAINE HCL 0.5 % OP SOLN
1.0000 [drp] | OPHTHALMIC | Status: AC
  Administered 2017-06-22 (×3): 1 [drp] via OPHTHALMIC

## 2017-06-22 MED ORDER — LIDOCAINE HCL 3.5 % OP GEL
1.0000 "application " | Freq: Once | OPHTHALMIC | Status: AC
  Administered 2017-06-22: 1 via OPHTHALMIC

## 2017-06-22 MED ORDER — MIDAZOLAM HCL 2 MG/2ML IJ SOLN
1.0000 mg | Freq: Once | INTRAMUSCULAR | Status: AC | PRN
  Administered 2017-06-22: 2 mg via INTRAVENOUS

## 2017-06-22 MED ORDER — LACTATED RINGERS IV SOLN
INTRAVENOUS | Status: DC
  Administered 2017-06-22: 08:00:00 via INTRAVENOUS

## 2017-06-22 MED ORDER — POVIDONE-IODINE 5 % OP SOLN
OPHTHALMIC | Status: DC | PRN
  Administered 2017-06-22: 1 via OPHTHALMIC

## 2017-06-22 MED ORDER — PHENYLEPHRINE HCL 2.5 % OP SOLN
1.0000 [drp] | OPHTHALMIC | Status: AC
  Administered 2017-06-22 (×3): 1 [drp] via OPHTHALMIC

## 2017-06-22 SURGICAL SUPPLY — 10 items

## 2017-06-22 NOTE — Anesthesia Preprocedure Evaluation (Signed)
Anesthesia Evaluation  Patient identified by MRN, date of birth, ID band Patient awake    Airway Mallampati: I       Dental  (+) Edentulous Upper, Edentulous Lower   Pulmonary former smoker,    Pulmonary exam normal        Cardiovascular hypertension, Pt. on home beta blockers  Rate:Bradycardia     Neuro/Psych    GI/Hepatic   Endo/Other    Renal/GU   negative genitourinary   Musculoskeletal negative musculoskeletal ROS (+)   Abdominal Normal abdominal exam  (+)   Peds  Hematology   Anesthesia Other Findings   Reproductive/Obstetrics negative OB ROS                             Anesthesia Physical Anesthesia Plan  ASA: III  Anesthesia Plan: MAC   Post-op Pain Management:    Induction: Intravenous  PONV Risk Score and Plan:   Airway Management Planned: Nasal Cannula  Additional Equipment:   Intra-op Plan:   Post-operative Plan:   Informed Consent:   Dental advisory given  Plan Discussed with:   Anesthesia Plan Comments:         Anesthesia Quick Evaluation

## 2017-06-22 NOTE — Discharge Instructions (Signed)
Moderate Conscious Sedation, Adult, Care After  These instructions provide you with information about caring for yourself after your procedure. Your health care provider may also give you more specific instructions. Your treatment has been planned according to current medical practices, but problems sometimes occur. Call your health care provider if you have any problems or questions after your procedure.  What can I expect after the procedure?  After your procedure, it is common:   To feel sleepy for several hours.   To feel clumsy and have poor balance for several hours.   To have poor judgment for several hours.   To vomit if you eat too soon.    Follow these instructions at home:  For at least 24 hours after the procedure:     Do not:  ? Participate in activities where you could fall or become injured.  ? Drive.  ? Use heavy machinery.  ? Drink alcohol.  ? Take sleeping pills or medicines that cause drowsiness.  ? Make important decisions or sign legal documents.  ? Take care of children on your own.   Rest.  Eating and drinking   Follow the diet recommended by your health care provider.   If you vomit:  ? Drink water, juice, or soup when you can drink without vomiting.  ? Make sure you have little or no nausea before eating solid foods.  General instructions   Have a responsible adult stay with you until you are awake and alert.   Take over-the-counter and prescription medicines only as told by your health care provider.   If you smoke, do not smoke without supervision.   Keep all follow-up visits as told by your health care provider. This is important.  Contact a health care provider if:   You keep feeling nauseous or you keep vomiting.   You feel light-headed.   You develop a rash.   You have a fever.  Get help right away if:   You have trouble breathing.  This information is not intended to replace advice given to you by your health care provider. Make sure you discuss any questions you have  with your health care provider.  Document Released: 10/05/2013 Document Revised: 05/19/2016 Document Reviewed: 04/05/2016  Elsevier Interactive Patient Education  2018 [employer]

## 2017-06-22 NOTE — Anesthesia Postprocedure Evaluation (Signed)
Anesthesia Post Note  Patient: Beverly Morgan  Procedure(s) Performed: Procedure(s) (LRB): CATARACT EXTRACTION PHACO AND INTRAOCULAR LENS PLACEMENT (IOC) (Right)  Patient location during evaluation: Short Stay Anesthesia Type: MAC Level of consciousness: awake and alert and patient cooperative Pain management: pain level controlled Vital Signs Assessment: post-procedure vital signs reviewed and stable Respiratory status: spontaneous breathing, nonlabored ventilation and respiratory function stable Cardiovascular status: blood pressure returned to baseline Postop Assessment: no signs of nausea or vomiting Anesthetic complications: no     Last Vitals:  Vitals:   06/22/17 0920 06/22/17 0925  BP: 117/63 112/64  Pulse:    Resp: 12 12  Temp:      Last Pain:  Vitals:   06/22/17 0717  TempSrc: Oral                 Nelsy Madonna J

## 2017-06-22 NOTE — H&P (Signed)
I have reviewed the H&P, the patient was re-examined, and I have identified no interval changes in medical condition and plan of care since the history and physical of record  

## 2017-06-22 NOTE — Op Note (Signed)
Date of Admission: 06/22/2017  Date of Surgery: 06/22/2017  Pre-Op Dx: Cataract Right  Eye  Post-Op Dx: Senile Nuclear Cataract  Right  Eye,  Dx Code H25.11  Surgeon: Tonny Branch, M.D.  Assistants: None  Anesthesia: Topical with MAC  Indications: Painless, progressive loss of vision with compromise of daily activities.  Surgery: Cataract Extraction with Intraocular lens Implant Right Eye  Discription: The patient had dilating drops and viscous lidocaine placed into the Right eye in the pre-op holding area. After transfer to the operating room, a time out was performed. The patient was then prepped and draped. Beginning with a 75m blade a paracentesis port was made at the surgeon's 2 o'clock position. The anterior chamber was then filled with 1% non-preserved lidocaine. This was followed by filling the anterior chamber with Provisc.  A 2.428mkeratome blade was used to make a clear corneal incision at the temporal limbus.  A bent cystatome needle was used to create a continuous tear capsulotomy. Hydrodissection was performed with balanced salt solution on a Fine canula. The lens nucleus was then removed using the phacoemulsification handpiece. Residual cortex was removed with the I&A handpiece. The anterior chamber and capsular bag were refilled with Provisc. A posterior chamber intraocular lens was placed into the capsular bag with it's injector. The implant was positioned with the Kuglan hook. The Provisc was then removed from the anterior chamber and capsular bag with the I&A handpiece. Stromal hydration of the main incision and paracentesis port was performed with BSS on a Fine canula. The wounds were tested for leak which was negative. The patient tolerated the procedure well. There were no operative complications. The patient was then transferred to the recovery room in stable condition.  Complications: None  Specimen: None  EBL: None  Prosthetic device: Abbott Technis, PCB00, power 24.5,  SN [id number]

## 2017-06-22 NOTE — Transfer of Care (Signed)
Immediate Anesthesia Transfer of Care Note  Patient: Beverly Morgan  Procedure(s) Performed: Procedure(s) with comments: CATARACT EXTRACTION PHACO AND INTRAOCULAR LENS PLACEMENT (IOC) (Right) - CDE: 3.10  Patient Location: Short Stay  Anesthesia Type:MAC  Level of Consciousness: awake, alert  and patient cooperative  Airway & Oxygen Therapy: Patient Spontanous Breathing  Post-op Assessment: Report given to RN, Post -op Vital signs reviewed and stable and Patient moving all extremities  Post vital signs: Reviewed and stable  Last Vitals:  Vitals:   06/22/17 0920 06/22/17 0925  BP: 117/63 112/64  Pulse:    Resp: 12 12  Temp:      Last Pain:  Vitals:   06/22/17 0717  TempSrc: Oral      Patients Stated Pain Goal: 8 (14/43/15 4008)  Complications: No apparent anesthesia complications

## 2017-06-23 ENCOUNTER — Encounter (HOSPITAL_COMMUNITY): Payer: Self-pay | Admitting: Ophthalmology

## 2017-06-23 NOTE — Addendum Note (Signed)
Addendum  created 06/23/17 0818 by Mikey College, MD   Anesthesia Attestations filed

## 2018-12-07 DIAGNOSIS — M5126 Other intervertebral disc displacement, lumbar region: Secondary | ICD-10-CM | POA: Diagnosis not present

## 2018-12-07 DIAGNOSIS — M47816 Spondylosis without myelopathy or radiculopathy, lumbar region: Secondary | ICD-10-CM | POA: Diagnosis not present

## 2018-12-08 ENCOUNTER — Other Ambulatory Visit: Payer: Self-pay | Admitting: Neurosurgery

## 2018-12-08 DIAGNOSIS — M47816 Spondylosis without myelopathy or radiculopathy, lumbar region: Secondary | ICD-10-CM

## 2018-12-15 ENCOUNTER — Ambulatory Visit
Admission: RE | Admit: 2018-12-15 | Discharge: 2018-12-15 | Disposition: A | Discharge status: Home / Self Care | Payer: Medicare HMO | Source: Ambulatory Visit | Attending: Neurosurgery | Admitting: Neurosurgery

## 2018-12-15 DIAGNOSIS — M47817 Spondylosis without myelopathy or radiculopathy, lumbosacral region: Secondary | ICD-10-CM | POA: Diagnosis not present

## 2018-12-15 DIAGNOSIS — M47816 Spondylosis without myelopathy or radiculopathy, lumbar region: Secondary | ICD-10-CM

## 2018-12-15 MED ORDER — METHYLPREDNISOLONE ACETATE 40 MG/ML INJ SUSP (RADIOLOG
120.0000 mg | Freq: Once | INTRAMUSCULAR | Status: AC
  Administered 2018-12-15: 120 mg via EPIDURAL

## 2018-12-15 MED ORDER — IOPAMIDOL (ISOVUE-M 200) INJECTION 41%
1.0000 mL | Freq: Once | INTRAMUSCULAR | Status: AC
  Administered 2018-12-15: 1 mL via EPIDURAL

## 2018-12-15 NOTE — Discharge Instructions (Signed)

## 2019-01-06 DIAGNOSIS — E785 Hyperlipidemia, unspecified: Secondary | ICD-10-CM | POA: Diagnosis not present

## 2019-01-06 DIAGNOSIS — R7309 Other abnormal glucose: Secondary | ICD-10-CM | POA: Diagnosis not present

## 2019-01-10 DIAGNOSIS — E559 Vitamin D deficiency, unspecified: Secondary | ICD-10-CM | POA: Diagnosis not present

## 2019-01-10 DIAGNOSIS — R7309 Other abnormal glucose: Secondary | ICD-10-CM | POA: Diagnosis not present

## 2019-01-10 DIAGNOSIS — M5136 Other intervertebral disc degeneration, lumbar region: Secondary | ICD-10-CM | POA: Diagnosis not present

## 2019-01-10 DIAGNOSIS — I10 Essential (primary) hypertension: Secondary | ICD-10-CM | POA: Diagnosis not present

## 2019-01-10 DIAGNOSIS — E785 Hyperlipidemia, unspecified: Secondary | ICD-10-CM | POA: Diagnosis not present

## 2019-01-10 DIAGNOSIS — J309 Allergic rhinitis, unspecified: Secondary | ICD-10-CM | POA: Diagnosis not present

## 2019-01-19 DIAGNOSIS — M47816 Spondylosis without myelopathy or radiculopathy, lumbar region: Secondary | ICD-10-CM | POA: Diagnosis not present

## 2019-01-19 DIAGNOSIS — M5126 Other intervertebral disc displacement, lumbar region: Secondary | ICD-10-CM | POA: Diagnosis not present

## 2019-01-20 ENCOUNTER — Other Ambulatory Visit: Payer: Self-pay | Admitting: Nurse Practitioner

## 2019-01-20 DIAGNOSIS — M5126 Other intervertebral disc displacement, lumbar region: Secondary | ICD-10-CM

## 2019-02-01 ENCOUNTER — Other Ambulatory Visit: Payer: Self-pay | Admitting: Nurse Practitioner

## 2019-02-01 ENCOUNTER — Ambulatory Visit
Admission: RE | Admit: 2019-02-01 | Discharge: 2019-02-01 | Disposition: A | Discharge status: Home / Self Care | Payer: Medicare HMO | Source: Ambulatory Visit | Attending: Nurse Practitioner | Admitting: Nurse Practitioner

## 2019-02-01 DIAGNOSIS — M5126 Other intervertebral disc displacement, lumbar region: Secondary | ICD-10-CM

## 2019-02-01 DIAGNOSIS — M4727 Other spondylosis with radiculopathy, lumbosacral region: Secondary | ICD-10-CM | POA: Diagnosis not present

## 2019-02-01 MED ORDER — IOPAMIDOL (ISOVUE-M 200) INJECTION 41%
1.0000 mL | Freq: Once | INTRAMUSCULAR | Status: AC
  Administered 2019-02-01: 1 mL via EPIDURAL

## 2019-02-01 MED ORDER — METHYLPREDNISOLONE ACETATE 40 MG/ML INJ SUSP (RADIOLOG
120.0000 mg | Freq: Once | INTRAMUSCULAR | Status: AC
  Administered 2019-02-01: 120 mg via EPIDURAL

## 2019-02-01 NOTE — Discharge Instructions (Signed)

## 2019-02-16 DIAGNOSIS — M5126 Other intervertebral disc displacement, lumbar region: Secondary | ICD-10-CM | POA: Diagnosis not present

## 2019-02-16 DIAGNOSIS — M47816 Spondylosis without myelopathy or radiculopathy, lumbar region: Secondary | ICD-10-CM | POA: Diagnosis not present

## 2019-02-22 DIAGNOSIS — M5440 Lumbago with sciatica, unspecified side: Secondary | ICD-10-CM | POA: Diagnosis not present

## 2019-03-02 DIAGNOSIS — Z6827 Body mass index (BMI) 27.0-27.9, adult: Secondary | ICD-10-CM | POA: Diagnosis not present

## 2019-03-02 DIAGNOSIS — I10 Essential (primary) hypertension: Secondary | ICD-10-CM | POA: Diagnosis not present

## 2019-03-02 DIAGNOSIS — M5416 Radiculopathy, lumbar region: Secondary | ICD-10-CM | POA: Diagnosis not present

## 2019-05-03 DIAGNOSIS — I10 Essential (primary) hypertension: Secondary | ICD-10-CM | POA: Diagnosis not present

## 2019-05-03 DIAGNOSIS — R7309 Other abnormal glucose: Secondary | ICD-10-CM | POA: Diagnosis not present

## 2019-05-03 DIAGNOSIS — E785 Hyperlipidemia, unspecified: Secondary | ICD-10-CM | POA: Diagnosis not present

## 2019-05-10 DIAGNOSIS — E559 Vitamin D deficiency, unspecified: Secondary | ICD-10-CM | POA: Diagnosis not present

## 2019-05-10 DIAGNOSIS — K219 Gastro-esophageal reflux disease without esophagitis: Secondary | ICD-10-CM | POA: Diagnosis not present

## 2019-05-10 DIAGNOSIS — R69 Illness, unspecified: Secondary | ICD-10-CM | POA: Diagnosis not present

## 2019-05-10 DIAGNOSIS — I340 Nonrheumatic mitral (valve) insufficiency: Secondary | ICD-10-CM | POA: Diagnosis not present

## 2019-05-10 DIAGNOSIS — I10 Essential (primary) hypertension: Secondary | ICD-10-CM | POA: Diagnosis not present

## 2019-05-10 DIAGNOSIS — R7309 Other abnormal glucose: Secondary | ICD-10-CM | POA: Diagnosis not present

## 2019-05-10 DIAGNOSIS — M5136 Other intervertebral disc degeneration, lumbar region: Secondary | ICD-10-CM | POA: Diagnosis not present

## 2019-05-10 DIAGNOSIS — E785 Hyperlipidemia, unspecified: Secondary | ICD-10-CM | POA: Diagnosis not present

## 2019-05-18 DIAGNOSIS — I10 Essential (primary) hypertension: Secondary | ICD-10-CM | POA: Diagnosis not present

## 2019-05-18 DIAGNOSIS — M5416 Radiculopathy, lumbar region: Secondary | ICD-10-CM | POA: Diagnosis not present

## 2019-05-18 DIAGNOSIS — Z6829 Body mass index (BMI) 29.0-29.9, adult: Secondary | ICD-10-CM | POA: Diagnosis not present

## 2019-06-21 DIAGNOSIS — M25561 Pain in right knee: Secondary | ICD-10-CM | POA: Diagnosis not present

## 2019-06-27 DIAGNOSIS — R002 Palpitations: Secondary | ICD-10-CM | POA: Insufficient documentation

## 2019-06-27 DIAGNOSIS — Z6829 Body mass index (BMI) 29.0-29.9, adult: Secondary | ICD-10-CM | POA: Diagnosis not present

## 2019-06-27 DIAGNOSIS — E782 Mixed hyperlipidemia: Secondary | ICD-10-CM | POA: Insufficient documentation

## 2019-06-27 DIAGNOSIS — M1711 Unilateral primary osteoarthritis, right knee: Secondary | ICD-10-CM | POA: Diagnosis not present

## 2019-06-27 DIAGNOSIS — I10 Essential (primary) hypertension: Secondary | ICD-10-CM | POA: Diagnosis not present

## 2019-06-27 DIAGNOSIS — I4891 Unspecified atrial fibrillation: Secondary | ICD-10-CM | POA: Diagnosis not present

## 2019-06-27 DIAGNOSIS — H3530 Unspecified macular degeneration: Secondary | ICD-10-CM | POA: Insufficient documentation

## 2019-06-30 DIAGNOSIS — R002 Palpitations: Secondary | ICD-10-CM | POA: Diagnosis not present

## 2019-06-30 DIAGNOSIS — I4891 Unspecified atrial fibrillation: Secondary | ICD-10-CM | POA: Diagnosis not present

## 2019-07-05 ENCOUNTER — Encounter: Payer: Self-pay | Admitting: *Deleted

## 2019-07-06 ENCOUNTER — Ambulatory Visit: Payer: Medicare HMO | Admitting: Cardiovascular Disease

## 2019-07-06 ENCOUNTER — Encounter: Payer: Self-pay | Admitting: Cardiovascular Disease

## 2019-07-06 VITALS — BP 130/80 | HR 58 | Ht 63.0 in | Wt 164.2 lb

## 2019-07-06 DIAGNOSIS — I493 Ventricular premature depolarization: Secondary | ICD-10-CM

## 2019-07-06 DIAGNOSIS — E7800 Pure hypercholesterolemia, unspecified: Secondary | ICD-10-CM

## 2019-07-06 DIAGNOSIS — I10 Essential (primary) hypertension: Secondary | ICD-10-CM

## 2019-07-06 DIAGNOSIS — R002 Palpitations: Secondary | ICD-10-CM

## 2019-07-06 NOTE — Progress Notes (Signed)
CARDIOLOGY CONSULT NOTE  Patient ID: Beverly Morgan MRN: [id number] DOB/AGE: [birth year] 68 y.o.  Admit date: (Not on file) Primary Physician: Bridget Hartshorn, NP Referring Physician: Theresa Duty PA-C  Reason for Consultation: Palpitations  HPI: Beverly Morgan is a 68 y.o. female who is being seen today for the evaluation of palpitations at the request of Alyssa Allwardt PA-C.  I reviewed notes from her PCP.  I personally reviewed an ECG performed on 06/27/2019 with the automatic interpretation showing "atrial fibrillation with aberrant conduction or ventricular premature complexes ".  My personal review of this ECG demonstrates sinus rhythm with PVCs.  There are discernible P waves preceding QRS complexes.  I personally reviewed an echocardiogram report dated 06/30/2019 which demonstrated normal left ventricular systolic function and normal diastolic function, LVEF 55 to 60%.  There were no significant valvular abnormalities.  Left atrium was normal in size.  Upon speaking with her, it appears she has been experiencing palpitations associated with shortness of breath over the past 2 weeks.  She denies dizziness, exertional chest pain, leg swelling, orthopnea, and paroxysmal nocturnal dyspnea.  She has had some exertional dyspnea over the past month.  She quit smoking in 1998.  She saw Dr. Lamar Blinks for chest tightness about 2 years ago in [place].  He apparently performed a nuclear stress test.  I do not have these records.  She has some chronic lower back pain and has had surgery for this.  Family history: Her brother had an MI with stents in his mid 46s.  He used to be a smoker.   Allergies  Allergen Reactions  . Aspirin Other (See Comments)    Stomach upset    Current Outpatient Medications  Medication Sig Dispense Refill  . carboxymethylcellulose (REFRESH PLUS) 0.5 % SOLN Place 1 drop into the left eye 3 (three) times daily as needed.    Marland Kitchen  LORazepam (ATIVAN) 1 MG tablet Take 1 mg by mouth at bedtime. Pt said she only takes one a couple of times a week    . metoprolol tartrate (LOPRESSOR) 50 MG tablet Take 1 tablet by mouth 2 (two) times a day.    . Multiple Vitamins-Minerals (PRESERVISION AREDS 2 PO) Take 1 tablet by mouth 2 (two) times daily.    . pravastatin (PRAVACHOL) 40 MG tablet Take 40 mg by mouth daily.     No current facility-administered medications for this visit.     Past Medical History:  Diagnosis Date  . Anxiety   . Hypercholesteremia   . Hypertension   . Macular degeneration   . Pre-diabetes   . Prediabetes     Past Surgical History:  Procedure Laterality Date  . BACK SURGERY     lumbar discectomy  . CATARACT EXTRACTION W/PHACO Left 05/28/2017   Procedure: CATARACT EXTRACTION PHACO AND INTRAOCULAR LENS PLACEMENT LEFT EYE cde=5.12;  Surgeon: Tonny Branch, MD;  Location: [facility];  Service: Ophthalmology;  Laterality: Left;  left  . CATARACT EXTRACTION W/PHACO Right 06/22/2017   Procedure: CATARACT EXTRACTION PHACO AND INTRAOCULAR LENS PLACEMENT (IOC);  Surgeon: Tonny Branch, MD;  Location: [facility];  Service: Ophthalmology;  Laterality: Right;  CDE: 3.10  . CESAREAN SECTION     x3  . COLONOSCOPY    . COLONOSCOPY N/A 05/22/2017   Procedure: COLONOSCOPY;  Surgeon: Daneil Dolin, MD;  Location: [facility];  Service: Endoscopy;  Laterality: N/A;  11:30 AM  . removal bakers cyst Right  Knee    Social History   Socioeconomic History  . Marital status: Married    Spouse name: Not on file  . Number of children: Not on file  . Years of education: Not on file  . Highest education level: Not on file  Occupational History  . Not on file  Social Needs  . Financial resource strain: Not on file  . Food insecurity    Worry: Not on file    Inability: Not on file  . Transportation needs    Medical: Not on file    Non-medical: Not on file  Tobacco Use  . Smoking status: Former Smoker    Packs/day: 2.00     Years: 11.00    Pack years: 22.00    Types: Cigarettes    Quit date: 05/21/1997    Years since quitting: 22.1  . Smokeless tobacco: Never Used  Substance and Sexual Activity  . Alcohol use: Yes    Comment: seldom  . Drug use: No  . Sexual activity: Yes    Birth control/protection: Post-menopausal  Lifestyle  . Physical activity    Days per week: Not on file    Minutes per session: Not on file  . Stress: Not on file  Relationships  . Social Herbalist on phone: Not on file    Gets together: Not on file    Attends religious service: Not on file    Active member of club or organization: Not on file    Attends meetings of clubs or organizations: Not on file    Relationship status: Not on file  . Intimate partner violence    Fear of current or ex partner: Not on file    Emotionally abused: Not on file    Physically abused: Not on file    Forced sexual activity: Not on file  Other Topics Concern  . Not on file  Social History Narrative  . Not on file      Current Meds  Medication Sig  . carboxymethylcellulose (REFRESH PLUS) 0.5 % SOLN Place 1 drop into the left eye 3 (three) times daily as needed.  Marland Kitchen LORazepam (ATIVAN) 1 MG tablet Take 1 mg by mouth at bedtime. Pt said she only takes one a couple of times a week  . metoprolol tartrate (LOPRESSOR) 50 MG tablet Take 1 tablet by mouth 2 (two) times a day.  . Multiple Vitamins-Minerals (PRESERVISION AREDS 2 PO) Take 1 tablet by mouth 2 (two) times daily.  . pravastatin (PRAVACHOL) 40 MG tablet Take 40 mg by mouth daily.      Review of systems complete and found to be negative unless listed above in HPI    Physical exam Blood pressure 130/80, pulse (!) 58, height 5\' 3"  (1.6 m), weight 164 lb 3.2 oz (74.5 kg), SpO2 100 %. General: NAD Neck: No JVD, no thyromegaly or thyroid nodule.  Lungs: Clear to auscultation bilaterally with normal respiratory effort. CV: Nondisplaced PMI. Regular rate and rhythm, normal  S1/S2, no S3/S4, no murmur.  No peripheral edema.  No carotid bruit.    Abdomen: Soft, nontender, no distention.  Skin: Intact without lesions or rashes.  Neurologic: Alert and oriented x 3.  Psych: Normal affect. Extremities: No clubbing or cyanosis.  HEENT: Normal.   ECG: Most recent ECG reviewed.   Labs: Lab Results  Component Value Date/Time   K 3.5 05/21/2017 08:11 AM   BUN 10 05/21/2017 08:11 AM   CREATININE 0.69 05/21/2017 08:11 AM  HGB 12.9 05/21/2017 08:11 AM     Lipids: No results found for: LDLCALC, LDLDIRECT, CHOL, TRIG, HDL      ASSESSMENT AND PLAN:  1.  Palpitations/PVCs: As stated above, the automated computer readout of the ECG was incorrect as there was no evidence of atrial fibrillation.  It actually shows sinus rhythm with discernible P waves and PVCs.  Given the frequency of her palpitations over the past 2 weeks, I will obtain a 3-week event monitor.  I will also obtain a comprehensive metabolic panel, magnesium, CBC, and TSH.  I will obtain records from Dr. Arrie Aran including nuclear stress test results from 2 years ago. Continue metoprolol 50 mg twice daily.  2.  Hypertension: Controlled on present therapy.  No changes.  3.  Hyperlipidemia: Continue pravastatin 40 mg.     Disposition: Follow up in 2-3 months  Signed: Kate Sable, M.D., F.A.C.C.  07/06/2019, 8:45 AM

## 2019-07-06 NOTE — Addendum Note (Signed)
Addended by: Laurine Blazer on: 07/06/2019 09:17 AM   Modules accepted: Orders

## 2019-07-06 NOTE — Patient Instructions (Signed)
Medication Instructions:  Continue all current medications.  Labwork:  CMET, TSH, CBC, Mg - orders given today.   Office will contact with results via phone or letter.    Testing/Procedures:  Your physician has recommended that you wear a 21 day event monitor. Event monitors are medical devices that record the heart's electrical activity. Doctors most often [place] these monitors to diagnose arrhythmias. Arrhythmias are problems with the speed or rhythm of the heartbeat. The monitor is a small, portable device. You can wear one while you do your normal daily activities. This is usually used to diagnose what is causing palpitations/syncope (passing out).  Office will contact with results via phone or letter.    Follow-Up: 2-3 months   Any Other Special Instructions Will Be Listed Below (If Applicable).  If you need a refill on your cardiac medications before your next appointment, please call your pharmacy.

## 2019-07-07 ENCOUNTER — Encounter: Payer: Self-pay | Admitting: *Deleted

## 2019-07-11 DIAGNOSIS — R002 Palpitations: Secondary | ICD-10-CM | POA: Diagnosis not present

## 2019-07-11 DIAGNOSIS — I493 Ventricular premature depolarization: Secondary | ICD-10-CM | POA: Diagnosis not present

## 2019-07-11 DIAGNOSIS — I10 Essential (primary) hypertension: Secondary | ICD-10-CM | POA: Diagnosis not present

## 2019-07-12 ENCOUNTER — Telehealth: Payer: Self-pay | Admitting: Cardiovascular Disease

## 2019-07-12 ENCOUNTER — Ambulatory Visit (INDEPENDENT_AMBULATORY_CARE_PROVIDER_SITE_OTHER): Payer: Medicare HMO

## 2019-07-12 DIAGNOSIS — R002 Palpitations: Secondary | ICD-10-CM | POA: Diagnosis not present

## 2019-07-12 NOTE — Telephone Encounter (Signed)
Patient informed that she can call the company if she needs help getting the monitor activated and placed. Verbalized understanding.

## 2019-07-12 NOTE — Telephone Encounter (Signed)
Patient received monitor today. Would like to know if she needs to contact company

## 2019-08-05 ENCOUNTER — Telehealth: Payer: Self-pay | Admitting: *Deleted

## 2019-08-05 NOTE — Telephone Encounter (Signed)
Notes recorded by Laurine Blazer, LPN on 08/02/1323 at 4:01 PM EDT  Patient notified. Copy to pmd. Follow up scheduled for 09/28/2019.    ------   Notes recorded by Herminio Commons, MD on 08/05/2019 at 11:26 AM EDT  Frequent extra beats noted. No evidence of atrial fibrillation. If she is still very symptomatic, could consider increasing the dose of Lopressor.

## 2019-09-12 DIAGNOSIS — R69 Illness, unspecified: Secondary | ICD-10-CM | POA: Diagnosis not present

## 2019-09-21 ENCOUNTER — Telehealth: Payer: Self-pay | Admitting: Cardiovascular Disease

## 2019-09-21 NOTE — Telephone Encounter (Signed)
Virtual Visit Pre-Appointment Phone Call  "(Name), I am calling you today to discuss your upcoming appointment. We are currently trying to limit exposure to the virus that causes COVID-19 by seeing patients at home rather than in the office."  1. "What is the BEST phone number to call the day of the visit?" - include this in appointment notes  2. Do you have or have access to (through a family member/friend) a smartphone with video capability that we can use for your visit?" a. If yes - list this number in appt notes as cell (if different from BEST phone #) and list the appointment type as a VIDEO visit in appointment notes b. If no - list the appointment type as a PHONE visit in appointment notes  3. Confirm consent - "In the setting of the current Covid19 crisis, you are scheduled for a (phone or video) visit with your provider on (date) at (time).  Just as we do with many in-office visits, in order for you to participate in this visit, we must obtain consent.  If you'd like, I can send this to your mychart (if signed up) or email for you to review.  Otherwise, I can obtain your verbal consent now.  All virtual visits are billed to your insurance company just like a normal visit would be.  By agreeing to a virtual visit, we'd like you to understand that the technology does not allow for your provider to perform an examination, and thus may limit your provider's ability to fully assess your condition. If your provider identifies any concerns that need to be evaluated in person, we will make arrangements to do so.  Finally, though the technology is pretty good, we cannot assure that it will always work on either your or our end, and in the setting of a video visit, we may have to convert it to a phone-only visit.  In either situation, we cannot ensure that we have a secure connection.  Are you willing to proceed?" STAFF: Did the patient verbally acknowledge consent to telehealth visit? Document  YES/NO here: yes  4. Advise patient to be prepared - "Two hours prior to your appointment, go ahead and check your blood pressure, pulse, oxygen saturation, and your weight (if you have the equipment to check those) and write them all down. When your visit starts, your provider will ask you for this information. If you have an Apple Watch or Kardia device, please plan to have heart rate information ready on the day of your appointment. Please have a pen and paper handy nearby the day of the visit as well."  5. Give patient instructions for MyChart download to smartphone OR Doximity/Doxy.me as below if video visit (depending on what platform provider is using)  6. Inform patient they will receive a phone call 15 minutes prior to their appointment time (may be from unknown caller ID) so they should be prepared to answer    TELEPHONE CALL NOTE  Beverly Morgan has been deemed a candidate for a follow-up tele-health visit to limit community exposure during the Covid-19 pandemic. I spoke with the patient via phone to ensure availability of phone/video source, confirm preferred email & phone number, and discuss instructions and expectations.  I reminded Beverly Morgan to be prepared with any vital sign and/or heart rhythm information that could potentially be obtained via home monitoring, at the time of her visit. I reminded Beverly Morgan to expect a phone call prior to  her visit.  Weston Anna 09/21/2019 12:34 PM   INSTRUCTIONS FOR DOWNLOADING THE MYCHART APP TO SMARTPHONE  - The patient must first make sure to have activated MyChart and know their login information - If Apple, go to [employer] and type in MyChart in the search bar and download the app. If Android, ask patient to go to [employer] and type in [facility] in the search bar and download the app. The app is free but as with any other app downloads, their phone may require them to verify saved payment information or  Apple/Android password.  - The patient will need to then log into the app with their MyChart username and password, and select [facility] as their healthcare provider to link the account. When it is time for your visit, go to the MyChart app, find appointments, and click Begin Video Visit. Be sure to Select Allow for your device to access the Microphone and Camera for your visit. You will then be connected, and your provider will be with you shortly.  **If they have any issues connecting, or need assistance please contact MyChart service desk [phone])**  **If using a computer, in order to ensure the best quality for their visit they will need to use either of the following Internet Browsers: [employer], or Google Chrome**  IF USING DOXIMITY or DOXY.ME - The patient will receive a link just prior to their visit by text.     FULL LENGTH CONSENT FOR TELE-HEALTH VISIT   I hereby voluntarily request, consent and authorize [facility] and its employed or contracted physicians, physician assistants, nurse practitioners or other licensed health care professionals (the Practitioner), to provide me with telemedicine health care services (the Services") as deemed necessary by the treating Practitioner. I acknowledge and consent to receive the Services by the Practitioner via telemedicine. I understand that the telemedicine visit will involve communicating with the Practitioner through live audiovisual communication technology and the disclosure of certain medical information by electronic transmission. I acknowledge that I have been given the opportunity to request an in-person assessment or other available alternative prior to the telemedicine visit and am voluntarily participating in the telemedicine visit.  I understand that I have the right to withhold or withdraw my consent to the use of telemedicine in the course of my care at any time, without affecting my right to future care  or treatment, and that the Practitioner or I may terminate the telemedicine visit at any time. I understand that I have the right to inspect all information obtained and/or recorded in the course of the telemedicine visit and may receive copies of available information for a reasonable fee.  I understand that some of the potential risks of receiving the Services via telemedicine include:   Delay or interruption in medical evaluation due to technological equipment failure or disruption;  Information transmitted may not be sufficient (e.g. poor resolution of images) to allow for appropriate medical decision making by the Practitioner; and/or   In rare instances, security protocols could fail, causing a breach of personal health information.  Furthermore, I acknowledge that it is my responsibility to provide information about my medical history, conditions and care that is complete and accurate to the best of my ability. I acknowledge that Practitioner's advice, recommendations, and/or decision may be based on factors not within their control, such as incomplete or inaccurate data provided by me or distortions of diagnostic images or specimens that may result from electronic transmissions. I  understand that the practice of medicine is not an exact science and that Practitioner makes no warranties or guarantees regarding treatment outcomes. I acknowledge that I will receive a copy of this consent concurrently upon execution via email to the email address I last provided but may also request a printed copy by calling the office of [facility].    I understand that my insurance will be billed for this visit.   I have read or had this consent read to me.  I understand the contents of this consent, which adequately explains the benefits and risks of the Services being provided via telemedicine.   I have been provided ample opportunity to ask questions regarding this consent and the Services and have had  my questions answered to my satisfaction.  I give my informed consent for the services to be provided through the use of telemedicine in my medical care  By participating in this telemedicine visit I agree to the above.

## 2019-09-28 ENCOUNTER — Telehealth: Payer: Medicare HMO | Admitting: Cardiovascular Disease

## 2019-12-26 DIAGNOSIS — E782 Mixed hyperlipidemia: Secondary | ICD-10-CM | POA: Diagnosis not present

## 2019-12-26 DIAGNOSIS — I4891 Unspecified atrial fibrillation: Secondary | ICD-10-CM | POA: Diagnosis not present

## 2019-12-26 DIAGNOSIS — R739 Hyperglycemia, unspecified: Secondary | ICD-10-CM | POA: Diagnosis not present

## 2019-12-26 DIAGNOSIS — R002 Palpitations: Secondary | ICD-10-CM | POA: Diagnosis not present

## 2019-12-26 DIAGNOSIS — I10 Essential (primary) hypertension: Secondary | ICD-10-CM | POA: Diagnosis not present

## 2019-12-28 DIAGNOSIS — H3530 Unspecified macular degeneration: Secondary | ICD-10-CM | POA: Diagnosis not present

## 2019-12-28 DIAGNOSIS — I10 Essential (primary) hypertension: Secondary | ICD-10-CM | POA: Diagnosis not present

## 2019-12-28 DIAGNOSIS — R002 Palpitations: Secondary | ICD-10-CM | POA: Diagnosis not present

## 2019-12-28 DIAGNOSIS — R739 Hyperglycemia, unspecified: Secondary | ICD-10-CM | POA: Diagnosis not present

## 2019-12-28 DIAGNOSIS — Z6829 Body mass index (BMI) 29.0-29.9, adult: Secondary | ICD-10-CM | POA: Diagnosis not present

## 2019-12-28 DIAGNOSIS — M1711 Unilateral primary osteoarthritis, right knee: Secondary | ICD-10-CM | POA: Diagnosis not present

## 2019-12-28 DIAGNOSIS — E782 Mixed hyperlipidemia: Secondary | ICD-10-CM | POA: Diagnosis not present

## 2020-04-09 DIAGNOSIS — M5441 Lumbago with sciatica, right side: Secondary | ICD-10-CM | POA: Diagnosis not present

## 2020-04-09 DIAGNOSIS — Z6829 Body mass index (BMI) 29.0-29.9, adult: Secondary | ICD-10-CM | POA: Diagnosis not present

## 2020-04-16 DIAGNOSIS — M5441 Lumbago with sciatica, right side: Secondary | ICD-10-CM | POA: Diagnosis not present

## 2020-05-15 DIAGNOSIS — H524 Presbyopia: Secondary | ICD-10-CM | POA: Diagnosis not present

## 2020-05-15 DIAGNOSIS — Z0100 Encounter for examination of eyes and vision without abnormal findings: Secondary | ICD-10-CM | POA: Diagnosis not present

## 2020-05-15 DIAGNOSIS — E119 Type 2 diabetes mellitus without complications: Secondary | ICD-10-CM | POA: Diagnosis not present

## 2020-05-15 DIAGNOSIS — Z961 Presence of intraocular lens: Secondary | ICD-10-CM | POA: Diagnosis not present

## 2020-05-15 DIAGNOSIS — H26493 Other secondary cataract, bilateral: Secondary | ICD-10-CM | POA: Diagnosis not present

## 2020-05-15 DIAGNOSIS — H353231 Exudative age-related macular degeneration, bilateral, with active choroidal neovascularization: Secondary | ICD-10-CM | POA: Diagnosis not present

## 2020-05-18 DIAGNOSIS — H43821 Vitreomacular adhesion, right eye: Secondary | ICD-10-CM | POA: Diagnosis not present

## 2020-05-18 DIAGNOSIS — H43811 Vitreous degeneration, right eye: Secondary | ICD-10-CM | POA: Diagnosis not present

## 2020-05-18 DIAGNOSIS — E119 Type 2 diabetes mellitus without complications: Secondary | ICD-10-CM | POA: Diagnosis not present

## 2020-05-18 DIAGNOSIS — H353132 Nonexudative age-related macular degeneration, bilateral, intermediate dry stage: Secondary | ICD-10-CM | POA: Diagnosis not present

## 2020-06-21 DIAGNOSIS — I10 Essential (primary) hypertension: Secondary | ICD-10-CM | POA: Diagnosis not present

## 2020-06-21 DIAGNOSIS — Z0001 Encounter for general adult medical examination with abnormal findings: Secondary | ICD-10-CM | POA: Diagnosis not present

## 2020-06-21 DIAGNOSIS — Z13220 Encounter for screening for lipoid disorders: Secondary | ICD-10-CM | POA: Diagnosis not present

## 2020-06-21 DIAGNOSIS — R739 Hyperglycemia, unspecified: Secondary | ICD-10-CM | POA: Diagnosis not present

## 2020-06-25 DIAGNOSIS — I10 Essential (primary) hypertension: Secondary | ICD-10-CM | POA: Diagnosis not present

## 2020-06-25 DIAGNOSIS — Z6829 Body mass index (BMI) 29.0-29.9, adult: Secondary | ICD-10-CM | POA: Diagnosis not present

## 2020-06-25 DIAGNOSIS — H3530 Unspecified macular degeneration: Secondary | ICD-10-CM | POA: Diagnosis not present

## 2020-06-25 DIAGNOSIS — R739 Hyperglycemia, unspecified: Secondary | ICD-10-CM | POA: Diagnosis not present

## 2020-06-25 DIAGNOSIS — E782 Mixed hyperlipidemia: Secondary | ICD-10-CM | POA: Diagnosis not present

## 2020-06-25 DIAGNOSIS — M5441 Lumbago with sciatica, right side: Secondary | ICD-10-CM | POA: Diagnosis not present

## 2020-06-25 DIAGNOSIS — Z0001 Encounter for general adult medical examination with abnormal findings: Secondary | ICD-10-CM | POA: Diagnosis not present

## 2020-06-25 DIAGNOSIS — R002 Palpitations: Secondary | ICD-10-CM | POA: Diagnosis not present

## 2020-08-17 DIAGNOSIS — E119 Type 2 diabetes mellitus without complications: Secondary | ICD-10-CM | POA: Diagnosis not present

## 2020-08-17 DIAGNOSIS — H353132 Nonexudative age-related macular degeneration, bilateral, intermediate dry stage: Secondary | ICD-10-CM | POA: Diagnosis not present

## 2020-08-17 DIAGNOSIS — H43821 Vitreomacular adhesion, right eye: Secondary | ICD-10-CM | POA: Diagnosis not present

## 2020-08-17 DIAGNOSIS — H43811 Vitreous degeneration, right eye: Secondary | ICD-10-CM | POA: Diagnosis not present

## 2020-08-30 DIAGNOSIS — Z23 Encounter for immunization: Secondary | ICD-10-CM | POA: Diagnosis not present

## 2020-09-10 DIAGNOSIS — Z1231 Encounter for screening mammogram for malignant neoplasm of breast: Secondary | ICD-10-CM | POA: Diagnosis not present

## 2020-09-27 DIAGNOSIS — Z23 Encounter for immunization: Secondary | ICD-10-CM | POA: Diagnosis not present

## 2020-10-04 DIAGNOSIS — Z23 Encounter for immunization: Secondary | ICD-10-CM | POA: Diagnosis not present

## 2020-12-04 DIAGNOSIS — H353132 Nonexudative age-related macular degeneration, bilateral, intermediate dry stage: Secondary | ICD-10-CM | POA: Diagnosis not present

## 2020-12-04 DIAGNOSIS — E119 Type 2 diabetes mellitus without complications: Secondary | ICD-10-CM | POA: Diagnosis not present

## 2020-12-04 DIAGNOSIS — H43811 Vitreous degeneration, right eye: Secondary | ICD-10-CM | POA: Diagnosis not present

## 2020-12-04 DIAGNOSIS — H43821 Vitreomacular adhesion, right eye: Secondary | ICD-10-CM | POA: Diagnosis not present

## 2020-12-25 DIAGNOSIS — I10 Essential (primary) hypertension: Secondary | ICD-10-CM | POA: Diagnosis not present

## 2020-12-25 DIAGNOSIS — E782 Mixed hyperlipidemia: Secondary | ICD-10-CM | POA: Diagnosis not present

## 2020-12-25 DIAGNOSIS — R739 Hyperglycemia, unspecified: Secondary | ICD-10-CM | POA: Diagnosis not present

## 2021-01-31 DIAGNOSIS — R059 Cough, unspecified: Secondary | ICD-10-CM | POA: Diagnosis not present

## 2021-01-31 DIAGNOSIS — Z20828 Contact with and (suspected) exposure to other viral communicable diseases: Secondary | ICD-10-CM | POA: Diagnosis not present

## 2021-03-25 DIAGNOSIS — Z1231 Encounter for screening mammogram for malignant neoplasm of breast: Secondary | ICD-10-CM | POA: Diagnosis not present

## 2021-04-01 DIAGNOSIS — Z23 Encounter for immunization: Secondary | ICD-10-CM | POA: Diagnosis not present

## 2021-04-02 DIAGNOSIS — H353132 Nonexudative age-related macular degeneration, bilateral, intermediate dry stage: Secondary | ICD-10-CM | POA: Diagnosis not present

## 2021-04-02 DIAGNOSIS — E119 Type 2 diabetes mellitus without complications: Secondary | ICD-10-CM | POA: Diagnosis not present

## 2021-04-02 DIAGNOSIS — H43821 Vitreomacular adhesion, right eye: Secondary | ICD-10-CM | POA: Diagnosis not present

## 2021-04-02 DIAGNOSIS — H43811 Vitreous degeneration, right eye: Secondary | ICD-10-CM | POA: Diagnosis not present

## 2021-05-20 DIAGNOSIS — H26493 Other secondary cataract, bilateral: Secondary | ICD-10-CM | POA: Diagnosis not present

## 2021-05-20 DIAGNOSIS — E119 Type 2 diabetes mellitus without complications: Secondary | ICD-10-CM | POA: Diagnosis not present

## 2021-05-20 DIAGNOSIS — H524 Presbyopia: Secondary | ICD-10-CM | POA: Diagnosis not present

## 2021-05-20 DIAGNOSIS — H353132 Nonexudative age-related macular degeneration, bilateral, intermediate dry stage: Secondary | ICD-10-CM | POA: Diagnosis not present

## 2021-05-20 DIAGNOSIS — Z961 Presence of intraocular lens: Secondary | ICD-10-CM | POA: Diagnosis not present

## 2021-05-21 DIAGNOSIS — Z0100 Encounter for examination of eyes and vision without abnormal findings: Secondary | ICD-10-CM | POA: Diagnosis not present

## 2021-06-24 DIAGNOSIS — E782 Mixed hyperlipidemia: Secondary | ICD-10-CM | POA: Diagnosis not present

## 2021-06-24 DIAGNOSIS — R739 Hyperglycemia, unspecified: Secondary | ICD-10-CM | POA: Diagnosis not present

## 2021-06-24 DIAGNOSIS — I4891 Unspecified atrial fibrillation: Secondary | ICD-10-CM | POA: Diagnosis not present

## 2021-06-24 DIAGNOSIS — E7849 Other hyperlipidemia: Secondary | ICD-10-CM | POA: Diagnosis not present

## 2021-06-24 DIAGNOSIS — I10 Essential (primary) hypertension: Secondary | ICD-10-CM | POA: Diagnosis not present

## 2021-06-27 DIAGNOSIS — Z0001 Encounter for general adult medical examination with abnormal findings: Secondary | ICD-10-CM | POA: Diagnosis not present

## 2021-06-27 DIAGNOSIS — M1711 Unilateral primary osteoarthritis, right knee: Secondary | ICD-10-CM | POA: Diagnosis not present

## 2021-06-27 DIAGNOSIS — R002 Palpitations: Secondary | ICD-10-CM | POA: Diagnosis not present

## 2021-06-27 DIAGNOSIS — I10 Essential (primary) hypertension: Secondary | ICD-10-CM | POA: Diagnosis not present

## 2021-06-27 DIAGNOSIS — E7849 Other hyperlipidemia: Secondary | ICD-10-CM | POA: Diagnosis not present

## 2021-06-27 DIAGNOSIS — R739 Hyperglycemia, unspecified: Secondary | ICD-10-CM | POA: Diagnosis not present

## 2021-06-27 DIAGNOSIS — Z23 Encounter for immunization: Secondary | ICD-10-CM | POA: Diagnosis not present

## 2021-06-27 DIAGNOSIS — L309 Dermatitis, unspecified: Secondary | ICD-10-CM | POA: Diagnosis not present

## 2021-07-16 DIAGNOSIS — H43811 Vitreous degeneration, right eye: Secondary | ICD-10-CM | POA: Diagnosis not present

## 2021-07-16 DIAGNOSIS — H353132 Nonexudative age-related macular degeneration, bilateral, intermediate dry stage: Secondary | ICD-10-CM | POA: Diagnosis not present

## 2021-07-16 DIAGNOSIS — H43821 Vitreomacular adhesion, right eye: Secondary | ICD-10-CM | POA: Diagnosis not present

## 2021-08-13 DIAGNOSIS — Z23 Encounter for immunization: Secondary | ICD-10-CM | POA: Diagnosis not present

## 2021-08-14 DIAGNOSIS — D225 Melanocytic nevi of trunk: Secondary | ICD-10-CM | POA: Diagnosis not present

## 2021-08-14 DIAGNOSIS — Z1283 Encounter for screening for malignant neoplasm of skin: Secondary | ICD-10-CM | POA: Diagnosis not present

## 2021-08-14 DIAGNOSIS — L738 Other specified follicular disorders: Secondary | ICD-10-CM | POA: Diagnosis not present

## 2021-09-11 DIAGNOSIS — H353134 Nonexudative age-related macular degeneration, bilateral, advanced atrophic with subfoveal involvement: Secondary | ICD-10-CM | POA: Diagnosis not present

## 2021-09-11 DIAGNOSIS — H26493 Other secondary cataract, bilateral: Secondary | ICD-10-CM | POA: Diagnosis not present

## 2021-09-11 DIAGNOSIS — H01001 Unspecified blepharitis right upper eyelid: Secondary | ICD-10-CM | POA: Diagnosis not present

## 2021-09-11 DIAGNOSIS — H01002 Unspecified blepharitis right lower eyelid: Secondary | ICD-10-CM | POA: Diagnosis not present

## 2021-09-11 DIAGNOSIS — H01004 Unspecified blepharitis left upper eyelid: Secondary | ICD-10-CM | POA: Diagnosis not present

## 2021-10-01 DIAGNOSIS — E119 Type 2 diabetes mellitus without complications: Secondary | ICD-10-CM | POA: Diagnosis not present

## 2021-10-01 DIAGNOSIS — H43821 Vitreomacular adhesion, right eye: Secondary | ICD-10-CM | POA: Diagnosis not present

## 2021-10-01 DIAGNOSIS — H353132 Nonexudative age-related macular degeneration, bilateral, intermediate dry stage: Secondary | ICD-10-CM | POA: Diagnosis not present

## 2021-10-01 DIAGNOSIS — H43811 Vitreous degeneration, right eye: Secondary | ICD-10-CM | POA: Diagnosis not present

## 2022-01-01 DIAGNOSIS — E782 Mixed hyperlipidemia: Secondary | ICD-10-CM | POA: Diagnosis not present

## 2022-01-01 DIAGNOSIS — R739 Hyperglycemia, unspecified: Secondary | ICD-10-CM | POA: Diagnosis not present

## 2022-01-01 DIAGNOSIS — E7849 Other hyperlipidemia: Secondary | ICD-10-CM | POA: Diagnosis not present

## 2022-01-01 DIAGNOSIS — I10 Essential (primary) hypertension: Secondary | ICD-10-CM | POA: Diagnosis not present

## 2022-02-06 DIAGNOSIS — M810 Age-related osteoporosis without current pathological fracture: Secondary | ICD-10-CM | POA: Diagnosis not present

## 2022-02-06 DIAGNOSIS — M8589 Other specified disorders of bone density and structure, multiple sites: Secondary | ICD-10-CM | POA: Diagnosis not present

## 2022-03-06 DIAGNOSIS — K5792 Diverticulitis of intestine, part unspecified, without perforation or abscess without bleeding: Secondary | ICD-10-CM | POA: Diagnosis not present

## 2022-03-06 DIAGNOSIS — N952 Postmenopausal atrophic vaginitis: Secondary | ICD-10-CM | POA: Diagnosis not present

## 2022-03-06 DIAGNOSIS — Z6828 Body mass index (BMI) 28.0-28.9, adult: Secondary | ICD-10-CM | POA: Diagnosis not present

## 2022-04-01 DIAGNOSIS — D485 Neoplasm of uncertain behavior of skin: Secondary | ICD-10-CM | POA: Diagnosis not present

## 2022-04-01 DIAGNOSIS — Z1283 Encounter for screening for malignant neoplasm of skin: Secondary | ICD-10-CM | POA: Diagnosis not present

## 2022-04-01 DIAGNOSIS — D225 Melanocytic nevi of trunk: Secondary | ICD-10-CM | POA: Diagnosis not present

## 2022-04-01 DIAGNOSIS — X32XXXD Exposure to sunlight, subsequent encounter: Secondary | ICD-10-CM | POA: Diagnosis not present

## 2022-04-01 DIAGNOSIS — L570 Actinic keratosis: Secondary | ICD-10-CM | POA: Diagnosis not present

## 2022-04-16 DIAGNOSIS — D485 Neoplasm of uncertain behavior of skin: Secondary | ICD-10-CM | POA: Diagnosis not present

## 2022-04-16 DIAGNOSIS — L988 Other specified disorders of the skin and subcutaneous tissue: Secondary | ICD-10-CM | POA: Diagnosis not present

## 2022-05-09 DIAGNOSIS — K5792 Diverticulitis of intestine, part unspecified, without perforation or abscess without bleeding: Secondary | ICD-10-CM | POA: Diagnosis not present

## 2022-05-09 DIAGNOSIS — N390 Urinary tract infection, site not specified: Secondary | ICD-10-CM | POA: Diagnosis not present

## 2022-05-09 DIAGNOSIS — Z6828 Body mass index (BMI) 28.0-28.9, adult: Secondary | ICD-10-CM | POA: Diagnosis not present

## 2022-05-15 DIAGNOSIS — R69 Illness, unspecified: Secondary | ICD-10-CM | POA: Diagnosis not present

## 2022-05-15 DIAGNOSIS — K5792 Diverticulitis of intestine, part unspecified, without perforation or abscess without bleeding: Secondary | ICD-10-CM | POA: Diagnosis not present

## 2022-05-15 DIAGNOSIS — Z6827 Body mass index (BMI) 27.0-27.9, adult: Secondary | ICD-10-CM | POA: Diagnosis not present

## 2022-05-15 DIAGNOSIS — Z113 Encounter for screening for infections with a predominantly sexual mode of transmission: Secondary | ICD-10-CM | POA: Diagnosis not present

## 2022-05-15 DIAGNOSIS — R319 Hematuria, unspecified: Secondary | ICD-10-CM | POA: Diagnosis not present

## 2022-05-19 DIAGNOSIS — E119 Type 2 diabetes mellitus without complications: Secondary | ICD-10-CM | POA: Diagnosis not present

## 2022-05-19 DIAGNOSIS — H43811 Vitreous degeneration, right eye: Secondary | ICD-10-CM | POA: Diagnosis not present

## 2022-05-19 DIAGNOSIS — H43821 Vitreomacular adhesion, right eye: Secondary | ICD-10-CM | POA: Diagnosis not present

## 2022-05-19 DIAGNOSIS — H353132 Nonexudative age-related macular degeneration, bilateral, intermediate dry stage: Secondary | ICD-10-CM | POA: Diagnosis not present

## 2022-05-29 ENCOUNTER — Encounter: Payer: Self-pay | Admitting: Internal Medicine

## 2022-06-26 ENCOUNTER — Encounter: Payer: Self-pay | Admitting: Gastroenterology

## 2022-06-26 NOTE — Progress Notes (Signed)
GI Office Note    Referring Provider: Bridget Hartshorn, NP Primary Care Physician:  Wanita Chamberlain, PA-C  Primary GI: Dr. Gala Romney  Chief Complaint   Chief Complaint  Patient presents with   diverticulits    Has had two diverticulitis flare ups one in March and the other in May.     History of Present Illness   Beverly Morgan is a 71 y.o. female presenting today at the request of Hemberg, Karie Schwalbe, NP for diverticulitis.   Last office visit here in 2008.   Last colonoscopy 05/22/2017 -pancolonic diverticulosis, single 6 mm polyp in the descending colon (tubular adenoma), exam otherwise normal.  Recommended surveillance in 7 years.   Patient saw her PCP 03/06/2022 and diagnosed with diverticulitis.  Unable to view visit notes, no abdominal imaging on file.  No recent lab work on file in the last 2 -3 years.  Office visit in May 2023 with PCP also for diverticulitis, note unavailable.    Today: Has never been good with bowel movements. Does not use anything over the counter. Stools are usually small firm balls with intermittent regular stools, usually a bowel movement about every other day or a few days in between. Has tried to drink more water but is not a water person. Goes to the bathroom well if she has coffee. She had went to black coffee and she has almost an instant bowel movement when she drinks it. First episode she had her belly pain was mid stomach mostly on the left and second time was lower abdominal pain. Denies BRBPR or melena. Had minor episode of diarrhea with her abdominal pain. Last episode in May. Did not have any fever or chills mostly just the abdominal pain. Denies unintentional weight loss, lack of appetite, early satiety. She queried about a specific diet she should follow or if there was something in particular in her diet that could be causing her flares. She wondered if fig nutens or other seeding foods could make it worse.   She reported episodes  in March and May.  She was treated both times with antibiotics.  She stated that she had done some reading stating that she should avoid coffee and that she may need a colonoscopy given that she has had diverticulitis.   Past Medical History:  Diagnosis Date   Anxiety    Hypercholesteremia    Hypertension    Macular degeneration    Pre-diabetes    Prediabetes     Past Surgical History:  Procedure Laterality Date   BACK SURGERY     lumbar discectomy   CATARACT EXTRACTION W/PHACO Left 05/28/2017   Procedure: CATARACT EXTRACTION PHACO AND INTRAOCULAR LENS PLACEMENT LEFT EYE cde=5.12;  Surgeon: Tonny Branch, MD;  Location: [facility];  Service: Ophthalmology;  Laterality: Left;  left   CATARACT EXTRACTION W/PHACO Right 06/22/2017   Procedure: CATARACT EXTRACTION PHACO AND INTRAOCULAR LENS PLACEMENT (IOC);  Surgeon: Tonny Branch, MD;  Location: [facility];  Service: Ophthalmology;  Laterality: Right;  CDE: 3.10   CESAREAN SECTION     x3   COLONOSCOPY     COLONOSCOPY N/A 05/22/2017   Dr. Gala Romney - pancolonic diverticulosis, single 6 mm polyp in the descending colon (tubular adenoma), exam otherwise normal   removal bakers cyst Right    Knee    Current Outpatient Medications  Medication Sig Dispense Refill   carboxymethylcellulose (REFRESH PLUS) 0.5 % SOLN Place 1 drop into the left eye 3 (three) times daily as  needed.     LORazepam (ATIVAN) 1 MG tablet Take 1 mg by mouth at bedtime. Pt said she only takes one a couple of times a week     metoprolol tartrate (LOPRESSOR) 50 MG tablet Take 1 tablet by mouth 2 (two) times a day.     Multiple Vitamins-Minerals (PRESERVISION AREDS 2 PO) Take 1 tablet by mouth 2 (two) times daily.     No current facility-administered medications for this visit.    Allergies as of 06/30/2022 - Review Complete 06/30/2022  Allergen Reaction Noted   Aspirin Other (See Comments) 10/14/2016    Family History  Problem Relation Age of Onset   Kidney disease Mother     Lung cancer Father    Diabetes Sister    Kidney cancer Brother    Diabetes Sister    Diabetes Sister    Diabetes Brother    Coronary artery disease Brother     Social History   Socioeconomic History   Marital status: Married    Spouse name: Not on file   Number of children: Not on file   Years of education: Not on file   Highest education level: Not on file  Occupational History   Not on file  Tobacco Use   Smoking status: Former    Packs/day: 2.00    Years: 11.00    Total pack years: 22.00    Types: Cigarettes    Quit date: 05/21/1997    Years since quitting: 25.1    Passive exposure: Past   Smokeless tobacco: Never  Vaping Use   Vaping Use: Never used  Substance and Sexual Activity   Alcohol use: Not Currently    Comment: seldom   Drug use: No   Sexual activity: Yes    Birth control/protection: Post-menopausal  Other Topics Concern   Not on file  Social History Narrative   Not on file   Social Determinants of Health   Financial Resource Strain: Not on file  Food Insecurity: Not on file  Transportation Needs: Not on file  Physical Activity: Not on file  Stress: Not on file  Social Connections: Not on file  Intimate Partner Violence: Not on file     Review of Systems   Gen: Denies any fever, chills, fatigue, weight loss, lack of appetite.  CV: Denies chest pain, heart palpitations, peripheral edema, syncope.  Resp: Denies shortness of breath at rest or with exertion. Denies wheezing or cough.  GI: see HPI GU : Denies urinary burning, urinary frequency, urinary hesitancy MS: Denies joint pain, muscle weakness, cramps, or limitation of movement.  Derm: Denies rash, itching, dry skin Psych: Denies depression, anxiety, memory loss, and confusion Heme: Denies bruising, bleeding, and enlarged lymph nodes.   Physical Exam   BP 134/76 (BP Location: Left Arm, Patient Position: Sitting, Cuff Size: Normal)   Pulse 64   Temp 98 F (36.7 C) (Temporal)   Ht  '5\' 3"'$  (1.6 m)   Wt 160 lb 3.2 oz (72.7 kg)   SpO2 94%   BMI 28.38 kg/m   General:   Alert and oriented. Pleasant and cooperative. Well-nourished and well-developed.  Head:  Normocephalic and atraumatic. Eyes:  Without icterus, sclera clear and conjunctiva pink.  Ears:  Normal auditory acuity. Lungs:  Clear to auscultation bilaterally. No wheezes, rales, or rhonchi. No distress.  Heart:  S1, S2 present without murmurs appreciated.  Abdomen:  +BS, soft, non-tender and non-distended. No HSM noted. No guarding or rebound. No masses appreciated.  Rectal:  Deferred  Msk:  Symmetrical without gross deformities. Normal posture. Extremities:  Without edema. Neurologic:  Alert and  oriented x4;  grossly normal neurologically. Skin:  Intact without significant lesions or rashes. Psych:  Alert and cooperative. Normal mood and affect.   Assessment   Beverly Morgan is a 71 y.o. female with a history of colon polyps, anxiety, HTN, prediabetes presenting today for evaluation post diverticulitis and constipation.   History of diverticulitis: She has had 2 recent diverticulitis flares, the first 1 being in March and a subsequent episode in May, treated with antibiotics.  She notes a longstanding history of constipation.  Bowel movements are typically every other day, stools are typically small balls with an occasional intermittent normal soft stool.  She states she has not been regular for quite some time and does not usually have many issues with this irregularity.  She states she has been drinking black coffee from time to time that helps stimulate her bowels and she will usually go after drinking it but is not very regular if she does not drink it.  No alarm symptoms present.  Last colonoscopy in 2018 noting pancolonic diverticulosis and a single tubular adenoma in the descending colon, originally recommended repeat in 7 years.  Discussed diverticulosis/diverticulitis with the patient and her husband,  focus is based on controlling constipation including a higher fiber diet and avoiding higher fat containing foods.  Provided patient handout regarding constipation and a higher fiber eating plan, advised her to begin taking Benefiber 2 teaspoons daily for 2 weeks and then increase to twice daily.  Discussed that we may need to trial MiraLAX nightly thereafter if constipation is not well controlled.  She admitted to an adequate water intake, encouraged her to drink at least 4 to 5 glasses water daily.  Given 2 recent bouts of diverticulitis and no recent colonoscopy, will proceed with colonoscopy with Dr. Gala Romney in the near future.  History of colon polyps: Given recent bouts of diverticulitis we will proceed with early interval colonoscopy.    PLAN   Benefiber 2 teaspoons daily for 2 weeks, then increase to twice daily. Higher fiber diet discussed, handout provided Avoid higher fat containing foods Constipation handout provided Trial miralax if needed.  Proceed with colonoscopy by Dr. Gala Romney in near future: the risks, benefits, and alternatives have been discussed with the patient in detail. The patient states understanding and desires to proceed. ASA 2 Follow up 2 months post procedure.    Venetia Night, MSN, FNP-BC, AGACNP-BC [facility]

## 2022-06-30 ENCOUNTER — Encounter: Payer: Self-pay | Admitting: Gastroenterology

## 2022-06-30 ENCOUNTER — Ambulatory Visit: Payer: Medicare HMO | Admitting: Gastroenterology

## 2022-06-30 VITALS — BP 134/76 | HR 64 | Temp 98.0°F | Ht 63.0 in | Wt 160.2 lb

## 2022-06-30 DIAGNOSIS — Z860100 Personal history of colon polyps, unspecified: Secondary | ICD-10-CM

## 2022-06-30 DIAGNOSIS — H43821 Vitreomacular adhesion, right eye: Secondary | ICD-10-CM | POA: Diagnosis not present

## 2022-06-30 DIAGNOSIS — H353132 Nonexudative age-related macular degeneration, bilateral, intermediate dry stage: Secondary | ICD-10-CM | POA: Diagnosis not present

## 2022-06-30 DIAGNOSIS — E119 Type 2 diabetes mellitus without complications: Secondary | ICD-10-CM | POA: Diagnosis not present

## 2022-06-30 DIAGNOSIS — Z8719 Personal history of other diseases of the digestive system: Secondary | ICD-10-CM

## 2022-06-30 DIAGNOSIS — H43811 Vitreous degeneration, right eye: Secondary | ICD-10-CM | POA: Diagnosis not present

## 2022-06-30 NOTE — Patient Instructions (Addendum)
For your constipation I want you to begin taking Benefiber 2 teaspoons daily for two weeks then increase to twice daily.  I am attaching a handout regarding all things constipation as well as a higher fiber eating plan.  I would also like for you to try increasing her water intake with at least 4 to 5 glasses of water daily.  Please give this about a month as it may take this long before you see a difference.  If after that timeframe you do not see a big change in her bowel movements, please begin taking MiraLAX 1 capful nightly.  We are scheduling you for colonoscopy in the near future with Dr. Gala Romney.  You will receive prep instructions in the mail once scheduled.  I have you follow-up about 2 months after your procedure, do not hesitate to contact us if you have any issues in between.  It was a pleasure to see you today! I want to create trusting relationships with patients. If you receive a survey regarding your visit,  I greatly appreciate you taking time to fill this out on paper or through your MyChart. I value your feedback.  Venetia Night, MSN, FNP-BC, AGACNP-BC [facility]

## 2022-07-02 ENCOUNTER — Telehealth: Payer: Self-pay | Admitting: *Deleted

## 2022-07-02 MED ORDER — PEG 3350-KCL-NA BICARB-NACL 420 G PO SOLR: ORAL | 0 refills | Status: DC

## 2022-07-02 NOTE — Telephone Encounter (Signed)
Spoke with pt. She has been scheduled for TCS with Dr. Gala Romney, ASA 2 ON 8/3 at [facility] will mail instructions and will send prep rx to pharmacy.

## 2022-07-30 ENCOUNTER — Telehealth: Payer: Self-pay | Admitting: *Deleted

## 2022-07-30 NOTE — Telephone Encounter (Signed)
Spoke with pt. Agreeable to Dr. Abbey Chatters doing procedure tomorrow. Aware of arrival time. Endo aware

## 2022-07-31 ENCOUNTER — Ambulatory Visit (HOSPITAL_COMMUNITY): Payer: Medicare HMO | Admitting: Anesthesiology

## 2022-07-31 ENCOUNTER — Ambulatory Visit (HOSPITAL_COMMUNITY)
Admission: RE | Admit: 2022-07-31 | Discharge: 2022-07-31 | Disposition: A | Discharge status: Home / Self Care | Payer: Medicare HMO | Attending: Internal Medicine | Admitting: Internal Medicine

## 2022-07-31 ENCOUNTER — Encounter (HOSPITAL_COMMUNITY)
Admission: RE | Disposition: A | Discharge status: Home / Self Care | Payer: Self-pay | Source: Home / Self Care | Attending: Internal Medicine

## 2022-07-31 ENCOUNTER — Encounter (HOSPITAL_COMMUNITY): Payer: Self-pay

## 2022-07-31 ENCOUNTER — Ambulatory Visit (HOSPITAL_BASED_OUTPATIENT_CLINIC_OR_DEPARTMENT_OTHER): Payer: Medicare HMO | Admitting: Anesthesiology

## 2022-07-31 DIAGNOSIS — K5732 Diverticulitis of large intestine without perforation or abscess without bleeding: Secondary | ICD-10-CM

## 2022-07-31 DIAGNOSIS — Z8719 Personal history of other diseases of the digestive system: Secondary | ICD-10-CM | POA: Diagnosis not present

## 2022-07-31 DIAGNOSIS — I10 Essential (primary) hypertension: Secondary | ICD-10-CM | POA: Insufficient documentation

## 2022-07-31 DIAGNOSIS — K648 Other hemorrhoids: Secondary | ICD-10-CM

## 2022-07-31 DIAGNOSIS — Z87891 Personal history of nicotine dependence: Secondary | ICD-10-CM | POA: Insufficient documentation

## 2022-07-31 DIAGNOSIS — Z09 Encounter for follow-up examination after completed treatment for conditions other than malignant neoplasm: Secondary | ICD-10-CM | POA: Insufficient documentation

## 2022-07-31 DIAGNOSIS — K5730 Diverticulosis of large intestine without perforation or abscess without bleeding: Secondary | ICD-10-CM | POA: Insufficient documentation

## 2022-07-31 HISTORY — PX: COLONOSCOPY WITH PROPOFOL: SHX5780

## 2022-07-31 SURGERY — COLONOSCOPY WITH PROPOFOL
Anesthesia: General

## 2022-07-31 MED ORDER — PROPOFOL 10 MG/ML IV BOLUS
INTRAVENOUS | Status: DC | PRN
  Administered 2022-07-31: 60 mg via INTRAVENOUS

## 2022-07-31 MED ORDER — LACTATED RINGERS IV SOLN: INTRAVENOUS | Status: DC | PRN

## 2022-07-31 MED ORDER — PROPOFOL 500 MG/50ML IV EMUL
INTRAVENOUS | Status: DC | PRN
  Administered 2022-07-31: 150 ug/kg/min via INTRAVENOUS

## 2022-07-31 NOTE — Discharge Instructions (Addendum)

## 2022-07-31 NOTE — Op Note (Signed)
[facility] Patient Name: Beverly Morgan Procedure Date: 07/31/2022 11:49 AM MRN: [id number] Date of Birth: October 24, 1951 Attending MD: Elon Alas. Abbey Chatters DO CSN: [id number] Age: 71 Admit Type: Outpatient Procedure:                Colonoscopy Indications:              Follow-up of diverticulitis Providers:                Elon Alas. Abbey Chatters, DO, Lambert Mody, Dereck Leep, Technician, Ladoris Gene, Technician,                            Randa Spike, Merchant navy officer Referring MD:              Medicines:                See the Anesthesia note for documentation of the                            administered medications Complications:            No immediate complications. Estimated Blood Loss:     Estimated blood loss: none. Procedure:                Pre-Anesthesia Assessment:                           - The anesthesia plan was to use monitored                            anesthesia care (MAC).                           After obtaining informed consent, the colonoscope                            was passed under direct vision. Throughout the                            procedure, the patient's blood pressure, pulse, and                            oxygen saturations were monitored continuously. The                            [id number]-HQ190L ([id number]) scope was introduced through                            the anus and advanced to the the cecum, identified                            by appendiceal orifice and ileocecal valve. The                            colonoscopy was performed without difficulty. The  patient tolerated the procedure well. The quality                            of the bowel preparation was evaluated using the                            BBPS [facility] Bowel Preparation Scale) with scores                            of: Right Colon = 3, Transverse Colon = 3 and Left                            Colon = 3 (entire mucosa  seen well with no residual                            staining, small fragments of stool or opaque                            liquid). The total BBPS score equals 9. Scope In: 12:01:30 PM Scope Out: 12:10:34 PM Scope Withdrawal Time: 0 hours 6 minutes 14 seconds  Total Procedure Duration: 0 hours 9 minutes 4 seconds  Findings:      The perianal and digital rectal examinations were normal.      Non-bleeding internal hemorrhoids were found during endoscopy.      Multiple small and large-mouthed diverticula were found in the sigmoid       colon and descending colon.      The exam was otherwise without abnormality. Impression:               - Non-bleeding internal hemorrhoids.                           - Diverticulosis in the sigmoid colon and in the                            descending colon.                           - The examination was otherwise normal.                           - No specimens collected. Moderate Sedation:      Per Anesthesia Care Recommendation:           - Patient has a contact number available for                            emergencies. The signs and symptoms of potential                            delayed complications were discussed with the                            patient. Return to normal activities tomorrow.  Written discharge instructions were provided to the                            patient.                           - Resume previous diet.                           - Continue present medications.                           - Repeat colonoscopy in 10 years for screening                            purposes.                           - Use fiber, for example Citrucel, Fibercon, Konsyl                            or Metamucil. Procedure Code(s):        --- Professional ---                           [phone], Colonoscopy, flexible; diagnostic, including                            collection of specimen(s) by brushing or washing,                             when performed (separate procedure) Diagnosis Code(s):        --- Professional ---                           K64.8, Other hemorrhoids                           K57.32, Diverticulitis of large intestine without                            perforation or abscess without bleeding                           K57.30, Diverticulosis of large intestine without                            perforation or abscess without bleeding CPT copyright 2019 American Medical Association. All rights reserved. The codes documented in this report are preliminary and upon coder review may  be revised to meet current compliance requirements. Elon Alas. Abbey Chatters, DO Gray Abbey Chatters, DO 07/31/2022 12:12:37 PM This report has been signed electronically. Number of Addenda: 0

## 2022-07-31 NOTE — Anesthesia Preprocedure Evaluation (Signed)
Anesthesia Evaluation  Patient identified by MRN, date of birth, ID band Patient awake    Reviewed: Allergy & Precautions, H&P , NPO status , Patient's Chart, lab work & pertinent test results, reviewed documented beta blocker date and time   Airway Mallampati: II  TM Distance: >3 FB Neck ROM: full    Dental no notable dental hx.    Pulmonary neg pulmonary ROS, former smoker,    Pulmonary exam normal breath sounds clear to auscultation       Cardiovascular Exercise Tolerance: Good hypertension, negative cardio ROS   Rhythm:regular Rate:Normal     Neuro/Psych PSYCHIATRIC DISORDERS Anxiety negative neurological ROS     GI/Hepatic negative GI ROS, Neg liver ROS,   Endo/Other  negative endocrine ROS  Renal/GU negative Renal ROS  negative genitourinary   Musculoskeletal   Abdominal   Peds  Hematology negative hematology ROS (+)   Anesthesia Other Findings   Reproductive/Obstetrics negative OB ROS                             Anesthesia Physical Anesthesia Plan  ASA: 2  Anesthesia Plan: General   Post-op Pain Management:    Induction:   PONV Risk Score and Plan: Propofol infusion  Airway Management Planned:   Additional Equipment:   Intra-op Plan:   Post-operative Plan:   Informed Consent: I have reviewed the patients History and Physical, chart, labs and discussed the procedure including the risks, benefits and alternatives for the proposed anesthesia with the patient or authorized representative who has indicated his/her understanding and acceptance.     Dental Advisory Given  Plan Discussed with: CRNA  Anesthesia Plan Comments:         Anesthesia Quick Evaluation

## 2022-07-31 NOTE — H&P (Signed)
Primary Care Physician:  Richelle Ito Primary Gastroenterologist:  Dr. Abbey Chatters  Pre-Procedure History & Physical: HPI:  Beverly Morgan is a 71 y.o. female is here for a colonoscopy to be performed for diverticulitis.   Past Medical History:  Diagnosis Date   Anxiety    Hypercholesteremia    Hypertension    Macular degeneration    Pre-diabetes    Prediabetes     Past Surgical History:  Procedure Laterality Date   BACK SURGERY     lumbar discectomy   CATARACT EXTRACTION W/PHACO Left 05/28/2017   Procedure: CATARACT EXTRACTION PHACO AND INTRAOCULAR LENS PLACEMENT LEFT EYE cde=5.12;  Surgeon: Tonny Branch, MD;  Location: [facility];  Service: Ophthalmology;  Laterality: Left;  left   CATARACT EXTRACTION W/PHACO Right 06/22/2017   Procedure: CATARACT EXTRACTION PHACO AND INTRAOCULAR LENS PLACEMENT (IOC);  Surgeon: Tonny Branch, MD;  Location: [facility];  Service: Ophthalmology;  Laterality: Right;  CDE: 3.10   CESAREAN SECTION     x3   COLONOSCOPY     COLONOSCOPY N/A 05/22/2017   Dr. Gala Romney - pancolonic diverticulosis, single 6 mm polyp in the descending colon (tubular adenoma), exam otherwise normal   removal bakers cyst Right    Knee    Prior to Admission medications   Medication Sig Start Date End Date Taking? Authorizing Provider  carboxymethylcellulose (REFRESH PLUS) 0.5 % SOLN Place 1 drop into both eyes 3 (three) times daily as needed (dry eyes).   Yes [provider]  metoprolol tartrate (LOPRESSOR) 50 MG tablet Take 50 mg by mouth 2 (two) times a day. 06/14/19  Yes [provider]  Multiple Vitamins-Minerals (PRESERVISION AREDS 2 PO) Take 1 capsule by mouth 2 (two) times daily.   Yes [provider]  Wheat Dextrin (CLEAR SOLUBLE FIBER) POWD Take 1 Scoop by mouth in the morning and at bedtime.   Yes [provider]  polyethylene glycol-electrolytes (NULYTELY) 420 g solution As directed 07/02/22   Rourk, Cristopher Estimable, MD    Allergies as of  07/02/2022 - Review Complete 06/30/2022  Allergen Reaction Noted   Aspirin Other (See Comments) 10/14/2016    Family History  Problem Relation Age of Onset   Kidney disease Mother    Lung cancer Father    Diabetes Sister    Kidney cancer Brother    Diabetes Sister    Diabetes Sister    Diabetes Brother    Coronary artery disease Brother     Social History   Socioeconomic History   Marital status: Married    Spouse name: Not on file   Number of children: Not on file   Years of education: Not on file   Highest education level: Not on file  Occupational History   Not on file  Tobacco Use   Smoking status: Former    Packs/day: 2.00    Years: 11.00    Total pack years: 22.00    Types: Cigarettes    Quit date: 05/21/1997    Years since quitting: 25.2    Passive exposure: Past   Smokeless tobacco: Never  Vaping Use   Vaping Use: Never used  Substance and Sexual Activity   Alcohol use: Not Currently    Comment: seldom   Drug use: No   Sexual activity: Yes    Birth control/protection: Post-menopausal  Other Topics Concern   Not on file  Social History Narrative   Not on file   Social Determinants of Health   Financial Resource Strain:  Not on file  Food Insecurity: Not on file  Transportation Needs: Not on file  Physical Activity: Not on file  Stress: Not on file  Social Connections: Not on file  Intimate Partner Violence: Not on file    Review of Systems: See HPI, otherwise negative ROS  Physical Exam: Vital signs in last 24 hours: Temp:  [97.8 F (36.6 C)] 97.8 F (36.6 C) (08/03 1115) Pulse Rate:  [75] 75 (08/03 1115) Resp:  [27] 27 (08/03 1115) BP: (151)/(81) 151/81 (08/03 1115) SpO2:  [99 %] 99 % (08/03 1115)   General:   Alert,  Well-developed, well-nourished, pleasant and cooperative in NAD Head:  Normocephalic and atraumatic. Eyes:  Sclera clear, no icterus.   Conjunctiva pink. Ears:  Normal auditory acuity. Nose:  No deformity, discharge,   or lesions. Mouth:  No deformity or lesions, dentition normal. Neck:  Supple; no masses or thyromegaly. Lungs:  Clear throughout to auscultation.   No wheezes, crackles, or rhonchi. No acute distress. Heart:  Regular rate and rhythm; no murmurs, clicks, rubs,  or gallops. Abdomen:  Soft, nontender and nondistended. No masses, hepatosplenomegaly or hernias noted. Normal bowel sounds, without guarding, and without rebound.   Msk:  Symmetrical without gross deformities. Normal posture. Extremities:  Without clubbing or edema. Neurologic:  Alert and  oriented x4;  grossly normal neurologically. Skin:  Intact without significant lesions or rashes. Cervical Nodes:  No significant cervical adenopathy. Psych:  Alert and cooperative. Normal mood and affect.  Impression/Plan: Beverly Morgan is here for a colonoscopy to be performed for diverticulitis.   The risks of the procedure including infection, bleed, or perforation as well as benefits, limitations, alternatives and imponderables have been reviewed with the patient. Questions have been answered. All parties agreeable.

## 2022-07-31 NOTE — Transfer of Care (Signed)
Immediate Anesthesia Transfer of Care Note  Patient: Beverly Morgan  Procedure(s) Performed: COLONOSCOPY WITH PROPOFOL  Patient Location: PACU  Anesthesia Type:General  Level of Consciousness: awake, alert  and oriented  Airway & Oxygen Therapy: Patient Spontanous Breathing  Post-op Assessment: Report given to RN, Post -op Vital signs reviewed and stable, Patient moving all extremities X 4 and Patient able to stick tongue midline  Post vital signs: Reviewed  Last Vitals:  Vitals Value Taken Time  BP 108/58   Temp 98.2   Pulse 71   Resp 12   SpO2 100     Last Pain:  Vitals:   07/31/22 1157  PainSc: 0-No pain         Complications: No notable events documented.

## 2022-08-01 NOTE — Anesthesia Postprocedure Evaluation (Signed)
Anesthesia Post Note  Patient: Beverly Morgan  Procedure(s) Performed: COLONOSCOPY WITH PROPOFOL  Patient location during evaluation: Phase II Anesthesia Type: General Level of consciousness: awake Pain management: pain level controlled Vital Signs Assessment: post-procedure vital signs reviewed and stable Respiratory status: spontaneous breathing and respiratory function stable Cardiovascular status: blood pressure returned to baseline and stable Postop Assessment: no headache and no apparent nausea or vomiting Anesthetic complications: no Comments: Late entry   No notable events documented.   Last Vitals:  Vitals:   07/31/22 1115 07/31/22 1215  BP: (!) 151/81 (!) 108/58  Pulse: 75 69  Resp: (!) 27 14  Temp: 36.6 C 36.8 C  SpO2: 99% 100%    Last Pain:  Vitals:   07/31/22 1215  TempSrc: Oral  PainSc: 0-No pain                 Louann Sjogren

## 2022-08-06 ENCOUNTER — Encounter (HOSPITAL_COMMUNITY): Payer: Self-pay | Admitting: Internal Medicine

## 2022-08-13 DIAGNOSIS — I10 Essential (primary) hypertension: Secondary | ICD-10-CM | POA: Diagnosis not present

## 2022-08-13 DIAGNOSIS — R739 Hyperglycemia, unspecified: Secondary | ICD-10-CM | POA: Diagnosis not present

## 2022-08-13 DIAGNOSIS — Z1329 Encounter for screening for other suspected endocrine disorder: Secondary | ICD-10-CM | POA: Diagnosis not present

## 2022-08-13 DIAGNOSIS — K5792 Diverticulitis of intestine, part unspecified, without perforation or abscess without bleeding: Secondary | ICD-10-CM | POA: Diagnosis not present

## 2022-08-13 DIAGNOSIS — E7849 Other hyperlipidemia: Secondary | ICD-10-CM | POA: Diagnosis not present

## 2022-08-19 DIAGNOSIS — R739 Hyperglycemia, unspecified: Secondary | ICD-10-CM | POA: Diagnosis not present

## 2022-08-19 DIAGNOSIS — K5790 Diverticulosis of intestine, part unspecified, without perforation or abscess without bleeding: Secondary | ICD-10-CM | POA: Diagnosis not present

## 2022-08-19 DIAGNOSIS — Z6828 Body mass index (BMI) 28.0-28.9, adult: Secondary | ICD-10-CM | POA: Diagnosis not present

## 2022-08-19 DIAGNOSIS — I341 Nonrheumatic mitral (valve) prolapse: Secondary | ICD-10-CM | POA: Diagnosis not present

## 2022-08-19 DIAGNOSIS — H3530 Unspecified macular degeneration: Secondary | ICD-10-CM | POA: Diagnosis not present

## 2022-08-19 DIAGNOSIS — E7849 Other hyperlipidemia: Secondary | ICD-10-CM | POA: Diagnosis not present

## 2022-08-19 DIAGNOSIS — Z0001 Encounter for general adult medical examination with abnormal findings: Secondary | ICD-10-CM | POA: Diagnosis not present

## 2022-08-19 DIAGNOSIS — N952 Postmenopausal atrophic vaginitis: Secondary | ICD-10-CM | POA: Diagnosis not present

## 2022-08-19 DIAGNOSIS — I10 Essential (primary) hypertension: Secondary | ICD-10-CM | POA: Diagnosis not present

## 2022-08-28 DIAGNOSIS — Z1231 Encounter for screening mammogram for malignant neoplasm of breast: Secondary | ICD-10-CM | POA: Diagnosis not present

## 2022-09-08 DIAGNOSIS — L820 Inflamed seborrheic keratosis: Secondary | ICD-10-CM | POA: Diagnosis not present

## 2022-09-08 DIAGNOSIS — D225 Melanocytic nevi of trunk: Secondary | ICD-10-CM | POA: Diagnosis not present

## 2022-09-08 DIAGNOSIS — Z1283 Encounter for screening for malignant neoplasm of skin: Secondary | ICD-10-CM | POA: Diagnosis not present

## 2022-10-09 DIAGNOSIS — Z23 Encounter for immunization: Secondary | ICD-10-CM | POA: Diagnosis not present

## 2022-11-03 DIAGNOSIS — H353132 Nonexudative age-related macular degeneration, bilateral, intermediate dry stage: Secondary | ICD-10-CM | POA: Diagnosis not present

## 2022-11-03 DIAGNOSIS — H43821 Vitreomacular adhesion, right eye: Secondary | ICD-10-CM | POA: Diagnosis not present

## 2022-11-03 DIAGNOSIS — H43811 Vitreous degeneration, right eye: Secondary | ICD-10-CM | POA: Diagnosis not present

## 2022-11-03 DIAGNOSIS — E119 Type 2 diabetes mellitus without complications: Secondary | ICD-10-CM | POA: Diagnosis not present

## 2023-01-07 DIAGNOSIS — F419 Anxiety disorder, unspecified: Secondary | ICD-10-CM | POA: Diagnosis not present

## 2023-01-07 DIAGNOSIS — R69 Illness, unspecified: Secondary | ICD-10-CM | POA: Diagnosis not present

## 2023-01-07 DIAGNOSIS — R454 Irritability and anger: Secondary | ICD-10-CM | POA: Diagnosis not present

## 2023-01-07 DIAGNOSIS — Z6829 Body mass index (BMI) 29.0-29.9, adult: Secondary | ICD-10-CM | POA: Diagnosis not present

## 2023-01-07 DIAGNOSIS — R030 Elevated blood-pressure reading, without diagnosis of hypertension: Secondary | ICD-10-CM | POA: Diagnosis not present

## 2023-02-18 DIAGNOSIS — R454 Irritability and anger: Secondary | ICD-10-CM | POA: Diagnosis not present

## 2023-02-18 DIAGNOSIS — F419 Anxiety disorder, unspecified: Secondary | ICD-10-CM | POA: Diagnosis not present

## 2023-02-18 DIAGNOSIS — R69 Illness, unspecified: Secondary | ICD-10-CM | POA: Diagnosis not present

## 2023-02-18 DIAGNOSIS — Z6829 Body mass index (BMI) 29.0-29.9, adult: Secondary | ICD-10-CM | POA: Diagnosis not present

## 2023-02-18 DIAGNOSIS — R030 Elevated blood-pressure reading, without diagnosis of hypertension: Secondary | ICD-10-CM | POA: Diagnosis not present

## 2023-03-09 DIAGNOSIS — H353122 Nonexudative age-related macular degeneration, left eye, intermediate dry stage: Secondary | ICD-10-CM | POA: Diagnosis not present

## 2023-03-09 DIAGNOSIS — E119 Type 2 diabetes mellitus without complications: Secondary | ICD-10-CM | POA: Diagnosis not present

## 2023-03-09 DIAGNOSIS — H524 Presbyopia: Secondary | ICD-10-CM | POA: Diagnosis not present

## 2023-03-09 DIAGNOSIS — H353113 Nonexudative age-related macular degeneration, right eye, advanced atrophic without subfoveal involvement: Secondary | ICD-10-CM | POA: Diagnosis not present

## 2023-03-09 DIAGNOSIS — H52223 Regular astigmatism, bilateral: Secondary | ICD-10-CM | POA: Diagnosis not present

## 2023-03-09 DIAGNOSIS — Z961 Presence of intraocular lens: Secondary | ICD-10-CM | POA: Diagnosis not present

## 2023-03-10 DIAGNOSIS — D225 Melanocytic nevi of trunk: Secondary | ICD-10-CM | POA: Diagnosis not present

## 2023-03-10 DIAGNOSIS — Z1283 Encounter for screening for malignant neoplasm of skin: Secondary | ICD-10-CM | POA: Diagnosis not present

## 2023-04-01 DIAGNOSIS — Z6830 Body mass index (BMI) 30.0-30.9, adult: Secondary | ICD-10-CM | POA: Diagnosis not present

## 2023-04-01 DIAGNOSIS — R739 Hyperglycemia, unspecified: Secondary | ICD-10-CM | POA: Diagnosis not present

## 2023-04-01 DIAGNOSIS — F419 Anxiety disorder, unspecified: Secondary | ICD-10-CM | POA: Diagnosis not present

## 2023-04-01 DIAGNOSIS — R454 Irritability and anger: Secondary | ICD-10-CM | POA: Diagnosis not present

## 2023-04-01 DIAGNOSIS — R69 Illness, unspecified: Secondary | ICD-10-CM | POA: Diagnosis not present

## 2023-04-01 DIAGNOSIS — Z1389 Encounter for screening for other disorder: Secondary | ICD-10-CM | POA: Diagnosis not present

## 2023-04-01 DIAGNOSIS — R030 Elevated blood-pressure reading, without diagnosis of hypertension: Secondary | ICD-10-CM | POA: Diagnosis not present

## 2023-04-01 DIAGNOSIS — Z1331 Encounter for screening for depression: Secondary | ICD-10-CM | POA: Diagnosis not present

## 2023-05-04 DIAGNOSIS — H35033 Hypertensive retinopathy, bilateral: Secondary | ICD-10-CM | POA: Diagnosis not present

## 2023-05-04 DIAGNOSIS — H353122 Nonexudative age-related macular degeneration, left eye, intermediate dry stage: Secondary | ICD-10-CM | POA: Diagnosis not present

## 2023-05-04 DIAGNOSIS — H43823 Vitreomacular adhesion, bilateral: Secondary | ICD-10-CM | POA: Diagnosis not present

## 2023-05-04 DIAGNOSIS — E119 Type 2 diabetes mellitus without complications: Secondary | ICD-10-CM | POA: Diagnosis not present

## 2023-05-04 DIAGNOSIS — H353113 Nonexudative age-related macular degeneration, right eye, advanced atrophic without subfoveal involvement: Secondary | ICD-10-CM | POA: Diagnosis not present

## 2023-06-22 DIAGNOSIS — F419 Anxiety disorder, unspecified: Secondary | ICD-10-CM | POA: Diagnosis not present

## 2023-06-22 DIAGNOSIS — R030 Elevated blood-pressure reading, without diagnosis of hypertension: Secondary | ICD-10-CM | POA: Diagnosis not present

## 2023-06-22 DIAGNOSIS — Z6829 Body mass index (BMI) 29.0-29.9, adult: Secondary | ICD-10-CM | POA: Diagnosis not present

## 2023-06-22 DIAGNOSIS — R454 Irritability and anger: Secondary | ICD-10-CM | POA: Diagnosis not present

## 2023-08-06 DIAGNOSIS — Z6829 Body mass index (BMI) 29.0-29.9, adult: Secondary | ICD-10-CM | POA: Diagnosis not present

## 2023-08-06 DIAGNOSIS — F419 Anxiety disorder, unspecified: Secondary | ICD-10-CM | POA: Diagnosis not present

## 2023-08-06 DIAGNOSIS — R454 Irritability and anger: Secondary | ICD-10-CM | POA: Diagnosis not present

## 2023-08-06 DIAGNOSIS — R030 Elevated blood-pressure reading, without diagnosis of hypertension: Secondary | ICD-10-CM | POA: Diagnosis not present

## 2023-11-10 DIAGNOSIS — E119 Type 2 diabetes mellitus without complications: Secondary | ICD-10-CM | POA: Diagnosis not present

## 2023-11-10 DIAGNOSIS — H43823 Vitreomacular adhesion, bilateral: Secondary | ICD-10-CM | POA: Diagnosis not present

## 2023-11-10 DIAGNOSIS — H35033 Hypertensive retinopathy, bilateral: Secondary | ICD-10-CM | POA: Diagnosis not present

## 2023-11-10 DIAGNOSIS — H353114 Nonexudative age-related macular degeneration, right eye, advanced atrophic with subfoveal involvement: Secondary | ICD-10-CM | POA: Diagnosis not present

## 2023-11-10 DIAGNOSIS — H353122 Nonexudative age-related macular degeneration, left eye, intermediate dry stage: Secondary | ICD-10-CM | POA: Diagnosis not present

## 2023-11-25 DIAGNOSIS — H9311 Tinnitus, right ear: Secondary | ICD-10-CM | POA: Diagnosis not present

## 2023-11-25 DIAGNOSIS — I10 Essential (primary) hypertension: Secondary | ICD-10-CM | POA: Diagnosis not present

## 2023-11-25 DIAGNOSIS — E7849 Other hyperlipidemia: Secondary | ICD-10-CM | POA: Diagnosis not present

## 2023-11-25 DIAGNOSIS — H3530 Unspecified macular degeneration: Secondary | ICD-10-CM | POA: Diagnosis not present

## 2023-11-25 DIAGNOSIS — Z6829 Body mass index (BMI) 29.0-29.9, adult: Secondary | ICD-10-CM | POA: Diagnosis not present

## 2023-11-25 DIAGNOSIS — Z23 Encounter for immunization: Secondary | ICD-10-CM | POA: Diagnosis not present

## 2023-11-25 DIAGNOSIS — R739 Hyperglycemia, unspecified: Secondary | ICD-10-CM | POA: Diagnosis not present

## 2023-11-25 DIAGNOSIS — Z0001 Encounter for general adult medical examination with abnormal findings: Secondary | ICD-10-CM | POA: Diagnosis not present

## 2023-11-25 DIAGNOSIS — F419 Anxiety disorder, unspecified: Secondary | ICD-10-CM | POA: Diagnosis not present

## 2023-12-04 DIAGNOSIS — I10 Essential (primary) hypertension: Secondary | ICD-10-CM | POA: Diagnosis not present

## 2023-12-04 DIAGNOSIS — I493 Ventricular premature depolarization: Secondary | ICD-10-CM | POA: Diagnosis not present

## 2023-12-04 DIAGNOSIS — Z0001 Encounter for general adult medical examination with abnormal findings: Secondary | ICD-10-CM | POA: Diagnosis not present

## 2023-12-04 DIAGNOSIS — E7849 Other hyperlipidemia: Secondary | ICD-10-CM | POA: Diagnosis not present

## 2024-05-09 DIAGNOSIS — Z961 Presence of intraocular lens: Secondary | ICD-10-CM | POA: Diagnosis not present

## 2024-05-09 DIAGNOSIS — H35033 Hypertensive retinopathy, bilateral: Secondary | ICD-10-CM | POA: Diagnosis not present

## 2024-05-09 DIAGNOSIS — H353114 Nonexudative age-related macular degeneration, right eye, advanced atrophic with subfoveal involvement: Secondary | ICD-10-CM | POA: Diagnosis not present

## 2024-05-09 DIAGNOSIS — H43823 Vitreomacular adhesion, bilateral: Secondary | ICD-10-CM | POA: Diagnosis not present

## 2024-05-09 DIAGNOSIS — H353122 Nonexudative age-related macular degeneration, left eye, intermediate dry stage: Secondary | ICD-10-CM | POA: Diagnosis not present

## 2024-05-09 DIAGNOSIS — E119 Type 2 diabetes mellitus without complications: Secondary | ICD-10-CM | POA: Diagnosis not present

## 2024-06-09 DIAGNOSIS — H903 Sensorineural hearing loss, bilateral: Secondary | ICD-10-CM | POA: Diagnosis not present

## 2024-06-09 DIAGNOSIS — H9311 Tinnitus, right ear: Secondary | ICD-10-CM | POA: Diagnosis not present

## 2024-08-31 DIAGNOSIS — I10 Essential (primary) hypertension: Secondary | ICD-10-CM | POA: Diagnosis not present

## 2024-08-31 DIAGNOSIS — R350 Frequency of micturition: Secondary | ICD-10-CM | POA: Diagnosis not present

## 2024-08-31 DIAGNOSIS — E782 Mixed hyperlipidemia: Secondary | ICD-10-CM | POA: Diagnosis not present

## 2024-08-31 DIAGNOSIS — Z6830 Body mass index (BMI) 30.0-30.9, adult: Secondary | ICD-10-CM | POA: Diagnosis not present

## 2024-08-31 DIAGNOSIS — N952 Postmenopausal atrophic vaginitis: Secondary | ICD-10-CM | POA: Diagnosis not present

## 2024-08-31 DIAGNOSIS — F419 Anxiety disorder, unspecified: Secondary | ICD-10-CM | POA: Diagnosis not present

## 2024-10-04 NOTE — Progress Notes (Signed)
 Beverly Morgan                                          MRN: [id number]   10/04/2024   The [facility] Quality Team Specialist reviewed this patient medical record for the purposes of chart review for care gap closure. The following were reviewed: chart review for care gap closure-kidney health evaluation for diabetes:uACR.    [facility] Quality Team

## 2024-12-14 DIAGNOSIS — H53413 Scotoma involving central area, bilateral: Secondary | ICD-10-CM | POA: Diagnosis not present
